# Patient Record
Sex: Female | Born: 1955 | Race: White | Hispanic: No | Smoking: Current every day smoker
Health system: Southern US, Community
[De-identification: ages and names within clinical notes are randomized; demographics above are authoritative.]

## PROBLEM LIST (undated history)

## (undated) DIAGNOSIS — F419 Anxiety disorder, unspecified: Secondary | ICD-10-CM

## (undated) DIAGNOSIS — G43909 Migraine, unspecified, not intractable, without status migrainosus: Secondary | ICD-10-CM

## (undated) DIAGNOSIS — R42 Dizziness and giddiness: Secondary | ICD-10-CM

## (undated) DIAGNOSIS — J449 Chronic obstructive pulmonary disease, unspecified: Secondary | ICD-10-CM

## (undated) HISTORY — PX: APPENDECTOMY: SHX54

## (undated) HISTORY — PX: TUBAL LIGATION: SHX77

---

## 2011-10-04 ENCOUNTER — Emergency Department (HOSPITAL_COMMUNITY)
Admission: EM | Admit: 2011-10-04 | Discharge: 2011-10-04 | Disposition: A | Discharge status: Home / Self Care | Payer: Self-pay | Attending: Emergency Medicine | Admitting: Emergency Medicine

## 2011-10-04 ENCOUNTER — Encounter (HOSPITAL_COMMUNITY): Payer: Self-pay

## 2011-10-04 ENCOUNTER — Emergency Department (HOSPITAL_COMMUNITY): Payer: Self-pay

## 2011-10-04 ENCOUNTER — Other Ambulatory Visit: Payer: Self-pay

## 2011-10-04 DIAGNOSIS — G43909 Migraine, unspecified, not intractable, without status migrainosus: Secondary | ICD-10-CM | POA: Insufficient documentation

## 2011-10-04 DIAGNOSIS — N390 Urinary tract infection, site not specified: Secondary | ICD-10-CM | POA: Insufficient documentation

## 2011-10-04 DIAGNOSIS — I509 Heart failure, unspecified: Secondary | ICD-10-CM | POA: Insufficient documentation

## 2011-10-04 DIAGNOSIS — H81399 Other peripheral vertigo, unspecified ear: Secondary | ICD-10-CM | POA: Insufficient documentation

## 2011-10-04 HISTORY — DX: Migraine, unspecified, not intractable, without status migrainosus: G43.909

## 2011-10-04 HISTORY — DX: Dizziness and giddiness: R42

## 2011-10-04 LAB — BASIC METABOLIC PANEL
CO2: 27 mEq/L (ref 19–32)
Calcium: 9.6 mg/dL (ref 8.4–10.5)
Creatinine, Ser: 0.61 mg/dL (ref 0.50–1.10)
GFR calc non Af Amer: >90 mL/min (ref >90)

## 2011-10-04 LAB — CBC
MCH: 33.5 pg (ref 26.0–34.0)
MCV: 94.7 fL (ref 78.0–100.0)
Platelets: 271 10*3/uL (ref 150–400)
RDW: 12.5 % (ref 11.5–15.5)
WBC: 10.2 10*3/uL (ref 4.0–10.5)

## 2011-10-04 LAB — POCT I-STAT TROPONIN I: Troponin i, poc: 0 ng/mL (ref 0.00–0.08)

## 2011-10-04 LAB — URINALYSIS, ROUTINE W REFLEX MICROSCOPIC
Ketones, ur: NEGATIVE mg/dL
Nitrite: POSITIVE — AB
Protein, ur: NEGATIVE mg/dL

## 2011-10-04 LAB — DIFFERENTIAL
Basophils Absolute: 0 10*3/uL (ref 0.0–0.1)
Eosinophils Absolute: 0.1 10*3/uL (ref 0.0–0.7)
Eosinophils Relative: 1 % (ref 0–5)
Lymphocytes Relative: 29 % (ref 12–46)
Neutrophils Relative %: 64 % (ref 43–77)

## 2011-10-04 LAB — URINE MICROSCOPIC-ADD ON

## 2011-10-04 MED ORDER — ONDANSETRON HCL 4 MG PO TABS: 4.0000 mg | ORAL_TABLET | Freq: Four times a day (QID) | ORAL | Status: AC | PRN

## 2011-10-04 MED ORDER — MECLIZINE HCL 12.5 MG PO TABS
50.0000 mg | ORAL_TABLET | Freq: Once | ORAL | Status: DC
  Filled 2011-10-04: qty 4

## 2011-10-04 MED ORDER — DEXTROSE 5 % IV SOLN: 1.0000 g | Freq: Once | INTRAVENOUS | Status: DC

## 2011-10-04 MED ORDER — DIPHENHYDRAMINE HCL 50 MG/ML IJ SOLN
25.0000 mg | Freq: Once | INTRAMUSCULAR | Status: AC
  Administered 2011-10-04: 25 mg via INTRAVENOUS
  Filled 2011-10-04: qty 1

## 2011-10-04 MED ORDER — CEFTRIAXONE SODIUM 1 G IJ SOLR
1.0000 g | Freq: Once | INTRAMUSCULAR | Status: AC
  Administered 2011-10-04: 1 g via INTRAMUSCULAR
  Filled 2011-10-04: qty 10

## 2011-10-04 MED ORDER — SODIUM CHLORIDE 0.9 % IV BOLUS (SEPSIS)
1000.0000 mL | Freq: Once | INTRAVENOUS | Status: AC
  Administered 2011-10-04: 1000 mL via INTRAVENOUS

## 2011-10-04 MED ORDER — CEPHALEXIN 500 MG PO CAPS: 500.0000 mg | ORAL_CAPSULE | Freq: Four times a day (QID) | ORAL | Status: AC

## 2011-10-04 MED ORDER — MECLIZINE HCL 25 MG PO TABS: 25.0000 mg | ORAL_TABLET | Freq: Three times a day (TID) | ORAL | Status: AC | PRN

## 2011-10-04 MED ORDER — ONDANSETRON HCL 4 MG/2ML IJ SOLN
4.0000 mg | INTRAMUSCULAR | Status: DC | PRN
  Administered 2011-10-04: 4 mg via INTRAVENOUS
  Filled 2011-10-04: qty 2

## 2011-10-04 MED ORDER — PROMETHAZINE HCL 25 MG/ML IJ SOLN
12.5000 mg | Freq: Once | INTRAMUSCULAR | Status: AC
  Administered 2011-10-04: 12.5 mg via INTRAVENOUS
  Filled 2011-10-04: qty 1

## 2011-10-04 NOTE — ED Provider Notes (Signed)
History     CSN: [id number]  Arrival date & time 10/04/11  1210   First MD Initiated Contact with Patient 10/04/11 1357      Chief Complaint  Patient presents with  . Dizziness    HPI Pt was seen at 1400.  Per pt, c/o gradual onset and persistence of multiple intermittent episodes of "dizziness" x3 years.  States she saw a MD 3 years ago in [place] for same but cannot recall what her dx was or what the name of the rx med she was given for her symptoms.  States she just remembers "they told me I was ok."  Describes her dizziness for the past 1 week as intermittent spinning, associated with nausea and feeling "off balance" while walking.  Symptoms worsen with head turning side-to-side and position changes.  Pt also c/o gradual onset and persistence of constant acute flair of her chronic migraine headache for the past week. Describes the headache as per her usual chronic migraine headache pain pattern for many years.  Denies headache was sudden or maximal in onset or at any time.  Denies visual changes, no focal motor weakness, no tingling/numbness in extremities, no fevers, no neck pain, no rash, no vomiting/diarrhea, no neck pain, no CP/palpitations, no SOB/cough, no abd pain, no injury.     Past Medical History  Diagnosis Date  . Dizziness   . Migraine headache     History reviewed. No pertinent past surgical history.    History  Substance Use Topics  . Smoking status: Not on file  . Smokeless tobacco: Not on file  . Alcohol Use: No    Review of Systems ROS: Statement: All systems negative except as marked or noted in the HPI; Constitutional: Negative for fever and chills. ; ; Eyes: Negative for eye pain, redness and discharge. ; ; ENMT: Negative for ear pain, hoarseness, nasal congestion, sinus pressure and sore throat. ; ; Cardiovascular: Negative for chest pain, palpitations, diaphoresis, dyspnea and peripheral edema. ; ; Respiratory: Negative for cough, wheezing and stridor. ;  ; Gastrointestinal: +nausea. Negative for vomiting, diarrhea, abdominal pain, blood in stool, hematemesis, jaundice and rectal bleeding.; ; Genitourinary: Negative for dysuria, flank pain and hematuria. ; ; Musculoskeletal: Negative for back pain and neck pain. Negative for swelling and trauma.; ; Skin: Negative for pruritus, rash, abrasions, blisters, bruising and skin lesion.; ; Neuro: +headache, dizziness.  Negative for lightheadedness and neck stiffness. Negative for weakness, altered level of consciousness , altered mental status, extremity weakness, paresthesias, involuntary movement, seizure and syncope.     Allergies  Review of patient's allergies indicates no known allergies.  Home Medications   Current Outpatient Rx  Name Route Sig Dispense Refill  . CALCIUM CARBONATE 600 MG PO TABS Oral Take 600 mg by mouth daily after breakfast.    . DIMENHYDRINATE 50 MG PO TABS Oral Take 50 mg by mouth every 8 (eight) hours as needed. Dizziness    . TETRAHYDROZOLINE-ZN SULFATE 0.05-0.25 % OP SOLN Both Eyes Place 2 drops into both eyes 3 (three) times daily as needed. Dry Eyes      BP 109/65  Pulse 63  Temp(Src) 98 F (36.7 C) (Oral)  Resp 16  Ht 5\' 5"  (1.651 m)  Wt 100 lb (45.36 kg)  BMI 16.64 kg/m2  SpO2 98%  Physical Exam 1405: Physical examination:  Nursing notes reviewed; Vital signs and O2 SAT reviewed;  Constitutional: Well developed, Well nourished, Uncomfortable appearing; Head:  Normocephalic, atraumatic; Eyes: EOMI, PERRL, No scleral  icterus; ENMT: Mouth and pharynx normal, Mucous membranes dry; Neck: Supple, Full range of motion, No lymphadenopathy; Cardiovascular: Regular rate and rhythm, No murmur, rub, or gallop; Respiratory: Breath sounds clear & equal bilaterally, No rales, rhonchi, wheezes, or rub, Normal respiratory effort/excursion; Chest: Nontender, Movement normal; Abdomen: Soft, Nontender, Nondistended, Normal bowel sounds; Genitourinary: No CVA tenderness; Extremities:  Pulses normal, No tenderness, No edema, No calf edema or asymmetry.; Neuro: AA&Ox3, Major CN grossly intact.  Strength 5/5 equal bilat UE's and LE's.  DTR 2/4 equal bilat UE's and LE's.  No gross sensory deficits.  Normal cerebellar testing after past pointing LLE and RUE, but pt corrects herself and performs finger-nose/heel-shin without past pointing when asked to perform the movements precisely, subsequent testing with bilat UE's and LE's cerebellar testing being normal.  No pronator drift.  Speech clear.  No facial droop.  +right horizontal gaze fatigable nystagmus which reproduces pt's symptoms, +left horizontal end-gaze fatigable nystagmus.; Skin: Color normal, Warm, Dry, no rash.    ED Course  Procedures   MDM  MDM Reviewed: vitals and nursing note Interpretation: ECG, x-ray, labs and CT scan    Date: 10/04/2011  Rate: 66  Rhythm: normal sinus rhythm  QRS Axis: normal  Intervals: normal  ST/T Wave abnormalities: normal  Conduction Disutrbances:none  Narrative Interpretation:   Old EKG Reviewed: none available.   Results for orders placed during the hospital encounter of 10/04/11  BASIC METABOLIC PANEL      Component Value Range   Sodium 139  135 - 145 (mEq/L)   Potassium 3.9  3.5 - 5.1 (mEq/L)   Chloride 105  96 - 112 (mEq/L)   CO2 27  19 - 32 (mEq/L)   Glucose, Bld 100 (*) 70 - 99 (mg/dL)   BUN 12  6 - 23 (mg/dL)   Creatinine, Ser 1.61  0.50 - 1.10 (mg/dL)   Calcium 9.6  8.4 - 09.6 (mg/dL)   GFR calc non Af Amer >90  >90 (mL/min)   GFR calc Af Amer >90  >90 (mL/min)  CBC      Component Value Range   WBC 10.2  4.0 - 10.5 (K/uL)   RBC 4.54  3.87 - 5.11 (MIL/uL)   Hemoglobin 15.2 (*) 12.0 - 15.0 (g/dL)   HCT 04.5  40.9 - 81.1 (%)   MCV 94.7  78.0 - 100.0 (fL)   MCH 33.5  26.0 - 34.0 (pg)   MCHC 35.3  30.0 - 36.0 (g/dL)   RDW 91.4  78.2 - 95.6 (%)   Platelets 271  150 - 400 (K/uL)  DIFFERENTIAL      Component Value Range   Neutrophils Relative 64  43 - 77 (%)    Neutro Abs 6.5  1.7 - 7.7 (K/uL)   Lymphocytes Relative 29  12 - 46 (%)   Lymphs Abs 3.0  0.7 - 4.0 (K/uL)   Monocytes Relative 7  3 - 12 (%)   Monocytes Absolute 0.7  0.1 - 1.0 (K/uL)   Eosinophils Relative 1  0 - 5 (%)   Eosinophils Absolute 0.1  0.0 - 0.7 (K/uL)   Basophils Relative 0  0 - 1 (%)   Basophils Absolute 0.0  0.0 - 0.1 (K/uL)  URINALYSIS, ROUTINE W REFLEX MICROSCOPIC      Component Value Range   Color, Urine YELLOW  YELLOW    APPearance CLEAR  CLEAR    Specific Gravity, Urine 1.015  1.005 - 1.030    pH 6.0  5.0 - 8.0  Glucose, UA NEGATIVE  NEGATIVE (mg/dL)   Hgb urine dipstick NEGATIVE  NEGATIVE    Bilirubin Urine NEGATIVE  NEGATIVE    Ketones, ur NEGATIVE  NEGATIVE (mg/dL)   Protein, ur NEGATIVE  NEGATIVE (mg/dL)   Urobilinogen, UA 0.2  0.0 - 1.0 (mg/dL)   Nitrite POSITIVE (*) NEGATIVE    Leukocytes, UA TRACE (*) NEGATIVE   POCT I-STAT TROPONIN I      Component Value Range   Troponin i, poc 0.00  0.00 - 0.08 (ng/mL)   Comment 3           URINE MICROSCOPIC-ADD ON      Component Value Range   Squamous Epithelial / LPF FEW (*) RARE    WBC, UA 3-6  <3 (WBC/hpf)   Bacteria, UA MANY (*) RARE     Dg Chest 2 View 10/04/2011  *RADIOLOGY REPORT*  Clinical Data: 56 year old female with weakness, dizziness, CHF.  CHEST - 2 VIEW  Comparison: [facility] 06/16/2011.  Findings: Stable large lung volumes.  Cardiac size and mediastinal contours are within normal limits.  Visualized tracheal air column is within normal limits.  No pneumothorax, pulmonary edema, pleural effusion or confluent pulmonary opacity. No acute osseous abnormality identified.  No pneumoperitoneum.  Negative visualized bowel gas pattern.  IMPRESSION: No acute cardiopulmonary abnormality.  Original Report Authenticated By: Harley Hallmark, M.D.   Ct Head Wo Contrast 10/04/2011  *RADIOLOGY REPORT*  Clinical Data: 56 year old female with headache, nausea, dizziness.  CT HEAD WITHOUT CONTRAST   Technique:  Contiguous axial images were obtained from the base of the skull through the vertex without contrast.  Comparison: None.  Findings: Visualized paranasal sinuses and mastoids are clear.  No acute osseous abnormality identified.  Visualized orbit soft tissues are within normal limits.  Visualized scalp soft tissues are within normal limits.  Cerebral volume is within normal limits for age.  No midline shift, ventriculomegaly, mass effect, evidence of mass lesion, intracranial hemorrhage or evidence of cortically based acute infarction.  Gray-white matter differentiation is within normal limits throughout the brain.  No suspicious intracranial vascular hyperdensity.  IMPRESSION: Normal noncontrast CT appearance of the brain.  Original Report Authenticated By: Harley Hallmark, M.D.   Mr Brain Wo Contrast 10/04/2011  *RADIOLOGY REPORT*  Clinical Data: Dizziness and nausea  MRI HEAD WITHOUT CONTRAST  Technique:  Multiplanar, multiecho pulse sequences of the brain and surrounding structures were obtained according to standard protocol without intravenous contrast.  Comparison: CT head 10/04/2011  Findings: Image quality degraded by moderate patient motion.  This level of motion could obscure small lesions.  Ventricle size is normal.  Pituitary is normal in size.  Corpus callosum is well formed.  Negative for acute infarct.  Small hyperintensities in the frontal white matter bilaterally, most likely related to chronic microvascular ischemia.  Brainstem and cerebellum are normal.  Negative for hemorrhage or mass lesion.  Paranasal sinuses are clear.  Mastoid sinus is clear.  IMPRESSION: Small chronic lesions in the frontal white matter bilaterally, likely due to chronic ischemia.  No acute abnormality.  Original Report Authenticated By: Camelia Phenes, M.D.    6:41 PM:  T/C to Neuro Dr. Roseanne Reno, case discussed, including:  HPI, pertinent PM/SHx, VS/PE, dx testing, ED course and treatment:  Agreeable with ED  treatment, as an aside: cannot have neuro lesion to effect LUE and RLE and this testing is normal when pt is instructed to perform the movements precisely, tx symptomatically.  Pt remains with neuro re-exams  intact for myself and ED RN when instructed to perform finger-nose/heel-shin precisely.  Pt ambulated with steady gait.  Feels better and wants to go home now.  Will give 1st dose abx for UTI, UC pending.  Dx testing d/w pt and family.  Questions answered.  Verb understanding, agreeable to d/c home with outpt f/u.       Laray Anger, DO 10/06/11 [phone]

## 2011-10-04 NOTE — ED Notes (Signed)
Complain of nausea, headache and dizziness that started four days ago

## 2011-10-04 NOTE — Discharge Instructions (Signed)
RESOURCE GUIDE  Dental Problems  Patients with Medicaid: [facility]                     [facility] [address]                                           [address] Phone:  [phone]                                                  Phone:  [phone]  If unable to pay or uninsured, contact:  [facility] or [facility] to become qualified for the adult dental clinic.  Chronic Pain Problems Contact [facility]  [phone] Patients need to be referred by their primary care doctor.  Insufficient Money for Medicine Contact [employer]:  call "211" or [facility] [phone].  No Primary Care Doctor Call [facility]  [phone] Other agencies that provide inexpensive medical care    [facility]  [phone]    [facility]  [phone]    [facility]  [phone]    Women's Clinic  [phone]    [facility]  [phone]    [facility]  [phone]  Psychological Services [facility]  [phone] [facility]  [phone] [facility]   [phone] (emergency services [phone])  Substance Abuse Resources [facility]  336-882-2125 [facility] 336-784-9470 [facility] 336-285-9073 [facility] 336-845-3988 Residential & Outpatient Substance Abuse Program  800-659-3381  Abuse/Neglect [place] Child Abuse Hotline (336) 641-3795 [place] Child Abuse Hotline 800-378-5315 (After Hours)  Emergency Shelter [facility] (336) 271-5985  Maternity Homes [facility] (336) 275-9566 [facility] (704) 372-4663  MRSA Hotline #:   832-7006    [place] [facility]  [facility]     [employer]                          [facility] [address] [place]                       [address]      [address]  [place]                                                [place]                            [place] Phone:  [phone]                                   Phone:  [phone]                 Phone:  [phone]  [facility] Phone:  [phone]    [facility] Child Abuse Hotline (463)308-0101 (331)287-4543 (After Hours)    Take the prescriptions as directed.  Call your regular medical doctor and the ENT doctor tomorrow morning to schedule a follow up appointment within the next week.  Return to the Emergency Department immediately sooner if worsening.

## 2011-10-04 NOTE — ED Notes (Signed)
Pt ambulated to the bathroom twice. States the first time she felt a little dizzy but the second time she did not

## 2011-10-06 LAB — URINE CULTURE

## 2011-10-07 NOTE — ED Notes (Signed)
Treated per protocol MD; Sensitive to same 

## 2013-05-16 ENCOUNTER — Ambulatory Visit: Payer: Self-pay | Admitting: Endocrinology

## 2013-05-30 ENCOUNTER — Ambulatory Visit: Payer: Self-pay | Admitting: Endocrinology

## 2018-04-09 ENCOUNTER — Encounter: Payer: Self-pay | Admitting: Nutrition

## 2019-09-06 ENCOUNTER — Ambulatory Visit
Admission: EM | Admit: 2019-09-06 | Discharge: 2019-09-06 | Disposition: A | Discharge status: Home / Self Care | Payer: Self-pay

## 2019-09-06 ENCOUNTER — Other Ambulatory Visit: Payer: Self-pay

## 2019-09-06 ENCOUNTER — Encounter (HOSPITAL_COMMUNITY): Payer: Self-pay | Admitting: Emergency Medicine

## 2019-09-06 ENCOUNTER — Emergency Department (HOSPITAL_COMMUNITY)
Admission: EM | Admit: 2019-09-06 | Discharge: 2019-09-06 | Disposition: A | Discharge status: Home / Self Care | Payer: Self-pay | Attending: Emergency Medicine | Admitting: Emergency Medicine

## 2019-09-06 DIAGNOSIS — Z5321 Procedure and treatment not carried out due to patient leaving prior to being seen by health care provider: Secondary | ICD-10-CM | POA: Insufficient documentation

## 2019-09-06 LAB — CBG MONITORING, ED: Glucose-Capillary: 122 mg/dL — ABNORMAL HIGH (ref 70–99)

## 2019-09-06 NOTE — ED Triage Notes (Signed)
Pt reports had syncopal episode this am. Pt reports was taken to [facility] and reports "couldn't find anything wrong with me". Pt reprots was discharged from there and went home. Pt spouse called EMS and brought pt here for further evaluation. pt reports has been treated for vertigo for the last several years.   Pt denies neuro symptoms or taking blood thinners at this time. Pt reports chronic back pain.   Poor concentration noted in triage.

## 2020-11-10 ENCOUNTER — Emergency Department (HOSPITAL_COMMUNITY): Payer: 59

## 2020-11-10 ENCOUNTER — Encounter (HOSPITAL_COMMUNITY): Payer: Self-pay | Admitting: Emergency Medicine

## 2020-11-10 ENCOUNTER — Other Ambulatory Visit: Payer: Self-pay

## 2020-11-10 ENCOUNTER — Inpatient Hospital Stay (HOSPITAL_COMMUNITY)
Admission: EM | Admit: 2020-11-10 | Discharge: 2020-11-13 | DRG: 917 | Disposition: A | Discharge status: Home / Self Care | Payer: 59 | Attending: Internal Medicine | Admitting: Internal Medicine

## 2020-11-10 DIAGNOSIS — I493 Ventricular premature depolarization: Secondary | ICD-10-CM | POA: Diagnosis present

## 2020-11-10 DIAGNOSIS — F1310 Sedative, hypnotic or anxiolytic abuse, uncomplicated: Secondary | ICD-10-CM | POA: Diagnosis present

## 2020-11-10 DIAGNOSIS — Z20822 Contact with and (suspected) exposure to covid-19: Secondary | ICD-10-CM | POA: Diagnosis present

## 2020-11-10 DIAGNOSIS — F1210 Cannabis abuse, uncomplicated: Secondary | ICD-10-CM | POA: Diagnosis present

## 2020-11-10 DIAGNOSIS — E876 Hypokalemia: Secondary | ICD-10-CM | POA: Diagnosis present

## 2020-11-10 DIAGNOSIS — F1910 Other psychoactive substance abuse, uncomplicated: Secondary | ICD-10-CM

## 2020-11-10 DIAGNOSIS — R1110 Vomiting, unspecified: Secondary | ICD-10-CM

## 2020-11-10 DIAGNOSIS — J9601 Acute respiratory failure with hypoxia: Secondary | ICD-10-CM | POA: Diagnosis not present

## 2020-11-10 DIAGNOSIS — G928 Other toxic encephalopathy: Secondary | ICD-10-CM | POA: Diagnosis present

## 2020-11-10 DIAGNOSIS — T405X1A Poisoning by cocaine, accidental (unintentional), initial encounter: Principal | ICD-10-CM | POA: Diagnosis present

## 2020-11-10 DIAGNOSIS — G9341 Metabolic encephalopathy: Secondary | ICD-10-CM

## 2020-11-10 DIAGNOSIS — T40711A Poisoning by cannabis, accidental (unintentional), initial encounter: Secondary | ICD-10-CM | POA: Diagnosis present

## 2020-11-10 DIAGNOSIS — R197 Diarrhea, unspecified: Secondary | ICD-10-CM | POA: Diagnosis present

## 2020-11-10 DIAGNOSIS — F1410 Cocaine abuse, uncomplicated: Secondary | ICD-10-CM | POA: Diagnosis present

## 2020-11-10 DIAGNOSIS — T424X1A Poisoning by benzodiazepines, accidental (unintentional), initial encounter: Secondary | ICD-10-CM | POA: Diagnosis present

## 2020-11-10 DIAGNOSIS — G9340 Encephalopathy, unspecified: Secondary | ICD-10-CM | POA: Diagnosis present

## 2020-11-10 DIAGNOSIS — R4182 Altered mental status, unspecified: Secondary | ICD-10-CM | POA: Diagnosis present

## 2020-11-10 DIAGNOSIS — R112 Nausea with vomiting, unspecified: Secondary | ICD-10-CM | POA: Diagnosis present

## 2020-11-10 DIAGNOSIS — I471 Supraventricular tachycardia: Secondary | ICD-10-CM | POA: Diagnosis present

## 2020-11-10 DIAGNOSIS — E872 Acidosis: Secondary | ICD-10-CM | POA: Diagnosis present

## 2020-11-10 LAB — CBC WITH DIFFERENTIAL/PLATELET
Abs Immature Granulocytes: 0.04 10*3/uL (ref 0.00–0.07)
Basophils Absolute: 0 10*3/uL (ref 0.0–0.1)
Basophils Relative: 0 %
Eosinophils Absolute: 0 10*3/uL (ref 0.0–0.5)
Eosinophils Relative: 0 %
HCT: 46 % (ref 36.0–46.0)
Hemoglobin: 14.8 g/dL (ref 12.0–15.0)
Immature Granulocytes: 0 %
Lymphocytes Relative: 8 %
Lymphs Abs: 1 10*3/uL (ref 0.7–4.0)
MCH: 31.2 pg (ref 26.0–34.0)
MCHC: 32.2 g/dL (ref 30.0–36.0)
MCV: 97 fL (ref 80.0–100.0)
Monocytes Absolute: 0.4 10*3/uL (ref 0.1–1.0)
Monocytes Relative: 4 %
Neutro Abs: 10.7 10*3/uL — ABNORMAL HIGH (ref 1.7–7.7)
Neutrophils Relative %: 88 %
Platelets: 164 10*3/uL (ref 150–400)
RBC: 4.74 MIL/uL (ref 3.87–5.11)
RDW: 14.7 % (ref 11.5–15.5)
WBC: 12.2 10*3/uL — ABNORMAL HIGH (ref 4.0–10.5)
nRBC: 0 % (ref 0.0–0.2)

## 2020-11-10 LAB — BLOOD GAS, ARTERIAL
Acid-base deficit: 2.6 mmol/L — ABNORMAL HIGH (ref 0.0–2.0)
Bicarbonate: 22.9 mmol/L (ref 20.0–28.0)
FIO2: 36
O2 Saturation: 96.5 %
Patient temperature: 37.4
pCO2 arterial: 30.4 mmHg — ABNORMAL LOW (ref 32.0–48.0)
pH, Arterial: 7.45 (ref 7.350–7.450)
pO2, Arterial: 92 mmHg (ref 83.0–108.0)

## 2020-11-10 LAB — COMPREHENSIVE METABOLIC PANEL
ALT: 29 U/L (ref 0–44)
AST: 30 U/L (ref 15–41)
Albumin: 4.2 g/dL (ref 3.5–5.0)
Alkaline Phosphatase: 78 U/L (ref 38–126)
Anion gap: 17 — ABNORMAL HIGH (ref 5–15)
BUN: 15 mg/dL (ref 8–23)
CO2: 18 mmol/L — ABNORMAL LOW (ref 22–32)
Calcium: 9.2 mg/dL (ref 8.9–10.3)
Chloride: 107 mmol/L (ref 98–111)
Creatinine, Ser: 0.61 mg/dL (ref 0.44–1.00)
GFR, Estimated: >60 mL/min (ref >60)
Glucose, Bld: 183 mg/dL — ABNORMAL HIGH (ref 70–99)
Potassium: 3.3 mmol/L — ABNORMAL LOW (ref 3.5–5.1)
Sodium: 142 mmol/L (ref 135–145)
Total Bilirubin: 0.7 mg/dL (ref 0.3–1.2)
Total Protein: 7.9 g/dL (ref 6.5–8.1)

## 2020-11-10 LAB — RESP PANEL BY RT-PCR (FLU A&B, COVID) ARPGX2
Influenza A by PCR: NEGATIVE
Influenza B by PCR: NEGATIVE
SARS Coronavirus 2 by RT PCR: NEGATIVE

## 2020-11-10 LAB — BASIC METABOLIC PANEL
Anion gap: 12 (ref 5–15)
BUN: 14 mg/dL (ref 8–23)
CO2: 21 mmol/L — ABNORMAL LOW (ref 22–32)
Calcium: 8.8 mg/dL — ABNORMAL LOW (ref 8.9–10.3)
Chloride: 108 mmol/L (ref 98–111)
Creatinine, Ser: 0.51 mg/dL (ref 0.44–1.00)
GFR, Estimated: >60 mL/min (ref >60)
Glucose, Bld: 117 mg/dL — ABNORMAL HIGH (ref 70–99)
Potassium: 3.2 mmol/L — ABNORMAL LOW (ref 3.5–5.1)
Sodium: 141 mmol/L (ref 135–145)

## 2020-11-10 LAB — RAPID URINE DRUG SCREEN, HOSP PERFORMED
Amphetamines: NOT DETECTED
Barbiturates: NOT DETECTED
Benzodiazepines: POSITIVE — AB
Cocaine: POSITIVE — AB
Opiates: NOT DETECTED
Tetrahydrocannabinol: POSITIVE — AB

## 2020-11-10 LAB — SALICYLATE LEVEL: Salicylate Lvl: <7 mg/dL — ABNORMAL LOW (ref 7.0–30.0)

## 2020-11-10 LAB — ETHANOL: Alcohol, Ethyl (B): <10 mg/dL (ref <10)

## 2020-11-10 LAB — URINALYSIS, ROUTINE W REFLEX MICROSCOPIC
Bacteria, UA: NONE SEEN
Bilirubin Urine: NEGATIVE
Glucose, UA: NEGATIVE mg/dL
Hgb urine dipstick: NEGATIVE
Ketones, ur: 80 mg/dL — AB
Leukocytes,Ua: NEGATIVE
Nitrite: NEGATIVE
Protein, ur: 100 mg/dL — AB
Specific Gravity, Urine: 1.023 (ref 1.005–1.030)
pH: 6 (ref 5.0–8.0)

## 2020-11-10 LAB — LACTIC ACID, PLASMA
Lactic Acid, Venous: 2.8 mmol/L (ref 0.5–1.9)
Lactic Acid, Venous: 1.8 mmol/L (ref 0.5–1.9)

## 2020-11-10 LAB — MAGNESIUM: Magnesium: 1.6 mg/dL — ABNORMAL LOW (ref 1.7–2.4)

## 2020-11-10 LAB — ACETAMINOPHEN LEVEL: Acetaminophen (Tylenol), Serum: <10 ug/mL — ABNORMAL LOW (ref 10–30)

## 2020-11-10 LAB — TROPONIN I (HIGH SENSITIVITY)
Troponin I (High Sensitivity): 10 ng/L (ref <18)
Troponin I (High Sensitivity): 11 ng/L (ref <18)

## 2020-11-10 LAB — AMMONIA: Ammonia: 19 umol/L (ref 9–35)

## 2020-11-10 LAB — LIPASE, BLOOD: Lipase: 19 U/L (ref 11–51)

## 2020-11-10 MED ORDER — ENOXAPARIN SODIUM 40 MG/0.4ML ~~LOC~~ SOLN
40.0000 mg | SUBCUTANEOUS | Status: DC
  Administered 2020-11-10 – 2020-11-12 (×3): 40 mg via SUBCUTANEOUS
  Filled 2020-11-10 (×3): qty 0.4

## 2020-11-10 MED ORDER — POLYETHYLENE GLYCOL 3350 17 G PO PACK: 17.0000 g | PACK | Freq: Every day | ORAL | Status: DC | PRN

## 2020-11-10 MED ORDER — ONDANSETRON HCL 4 MG PO TABS: 4.0000 mg | ORAL_TABLET | Freq: Four times a day (QID) | ORAL | Status: DC | PRN

## 2020-11-10 MED ORDER — VANCOMYCIN HCL 1250 MG/250ML IV SOLN
1250.0000 mg | Freq: Once | INTRAVENOUS | Status: AC
  Administered 2020-11-10: 1250 mg via INTRAVENOUS
  Filled 2020-11-10: qty 250

## 2020-11-10 MED ORDER — SODIUM CHLORIDE 0.9 % IV BOLUS: 1000.0000 mL | Freq: Once | INTRAVENOUS | Status: DC

## 2020-11-10 MED ORDER — LACTATED RINGERS IV SOLN
INTRAVENOUS | Status: DC
Start: 2020-11-10 — End: 2020-11-10

## 2020-11-10 MED ORDER — LACTATED RINGERS IV BOLUS (SEPSIS)
1000.0000 mL | Freq: Once | INTRAVENOUS | Status: AC
  Administered 2020-11-10: 1000 mL via INTRAVENOUS

## 2020-11-10 MED ORDER — ACETAMINOPHEN 325 MG PO TABS: 650.0000 mg | ORAL_TABLET | Freq: Four times a day (QID) | ORAL | Status: DC | PRN

## 2020-11-10 MED ORDER — ACETAMINOPHEN 650 MG RE SUPP: 650.0000 mg | Freq: Four times a day (QID) | RECTAL | Status: DC | PRN

## 2020-11-10 MED ORDER — ONDANSETRON HCL 4 MG/2ML IJ SOLN
4.0000 mg | Freq: Four times a day (QID) | INTRAMUSCULAR | Status: DC | PRN
  Administered 2020-11-11: 4 mg via INTRAVENOUS
  Filled 2020-11-10: qty 2

## 2020-11-10 MED ORDER — POTASSIUM CHLORIDE IN NACL 40-0.9 MEQ/L-% IV SOLN: INTRAVENOUS | Status: AC

## 2020-11-10 MED ORDER — SODIUM CHLORIDE 0.9 % IV SOLN
2.0000 g | Freq: Once | INTRAVENOUS | Status: AC
  Administered 2020-11-10: 2 g via INTRAVENOUS
  Filled 2020-11-10: qty 2

## 2020-11-10 MED ORDER — VANCOMYCIN HCL IN DEXTROSE 1-5 GM/200ML-% IV SOLN
1000.0000 mg | Freq: Once | INTRAVENOUS | Status: DC
Start: 2020-11-10 — End: 2020-11-10

## 2020-11-10 MED ORDER — POTASSIUM CHLORIDE CRYS ER 20 MEQ PO TBCR
40.0000 meq | EXTENDED_RELEASE_TABLET | Freq: Once | ORAL | Status: AC
  Administered 2020-11-10: 40 meq via ORAL
  Filled 2020-11-10: qty 2

## 2020-11-10 NOTE — ED Notes (Signed)
Lab tech at bedside

## 2020-11-10 NOTE — Sepsis Progress Note (Signed)
elink monitoring code sepsis.  

## 2020-11-10 NOTE — ED Notes (Signed)
O2 turned off.

## 2020-11-10 NOTE — ED Notes (Signed)
Pt is very anxious. 

## 2020-11-10 NOTE — ED Notes (Signed)
PCXR done

## 2020-11-10 NOTE — ED Triage Notes (Signed)
Came via EMS form home.  Husband called out that pt has been vomiting since yesterday bright yellow emesis.  Pt is anxious per EMS.  C/o headache.

## 2020-11-10 NOTE — H&P (Signed)
History and Physical    [name] NWG:[id number] DOB: September 20, 1955 DOA: 11/10/2020  PCP: Kirstie Peri, MD   Patient coming from: Home  I have personally briefly reviewed patient's old medical records in [facility]  Chief Complaint: Confusion, Vomiting  HPI: Helen Tapia is a 65 y.o. female with medical history significant for dizziness, migraines. Patient presented to the ED via EMS with reports of vomiting since yesterday and confusion. History from patient is limited, she does not remember the events of yesterday or today, she is not really sure she why she was brought to the ED.  She is able to answer questions, is able to tell me her name and knows she is in the hospital.  She tells me she takes gabapentin, but denies any other illicit or otherwise.  She denies alcohol intake.  She tells me she lives with her boyfriend -she tells me his name is [place].  Since arrival in the ED, no episodes of vomiting, no loose stools, no abdominal pain.  She reports onset of low back pain today, without lower extremity weakness or numbness.  ED provider was able to contact patient's husband recommended getting a tox screen on patient's and calling patient's live-in boyfriend.  Patient's live-in boyfriend- Clyde Miller-reported patient had been vomiting yesterday, and that patient became confused.  He admits to marijuana use by patient, but denies any other drug use.  Reports occasional alcohol intake, last alcoholic beverage was daiquri 3 days ago.  ED Course: T max-99.4, heart rate ranging from 33 to 92, respiratory rate 15- 34.  Blood pressure systolic 109-149.  O2 sats  > 92% on room air.  UDS positive for cocaine, benzodiazepine and tetrahydrocannabinol.  Portable chest x-ray head CT without acute abnormality. With lactic acid of 2.8 >> 1.8, there was a concern for sepsis, so ceftriaxone and vancomycin was started, 1 L bolus Ringer's lactate given.  Review of Systems: As per HPI all other systems  reviewed and negative.  Past Medical History:  Diagnosis Date  . Dizziness   . Migraine headache     History reviewed. No pertinent surgical history.   reports that she has never smoked. She has never used smokeless tobacco. She reports that she does not drink alcohol and does not use drugs.  No Known Allergies  Unable to ascertain due to altered mental status  Prior to Admission medications   Medication Sig Start Date End Date Taking? Authorizing Provider  calcium carbonate (OS-CAL) 600 MG TABS Take 600 mg by mouth daily after breakfast.    [provider]  dimenhyDRINATE (DRAMAMINE) 50 MG tablet Take 50 mg by mouth every 8 (eight) hours as needed. Dizziness    [provider]  tetrahydrozoline-zinc (VISINE-AC) 0.05-0.25 % ophthalmic solution Place 2 drops into both eyes 3 (three) times daily as needed. Dry Eyes    [provider]    Physical Exam: Vitals:   11/10/20 1630 11/10/20 1700 11/10/20 1730 11/10/20 1800  BP: 117/71 131/64 121/70 139/68  Pulse: 61 68 (!) 35 (!) 41  Resp: (!) 22 18 17  (!) 22  Temp:      TempSrc:      SpO2: 95% 93% 93% 93%  Weight:      Height:        Constitutional: NAD, calm, comfortable with intermittent episodes of what appears to be anxiety/panic attacks which patient's reports she has a history of. Vitals:   11/10/20 1630 11/10/20 1700 11/10/20 1730 11/10/20 1800  BP: 117/71 131/64  121/70 139/68  Pulse: 61 68 (!) 35 (!) 41  Resp: (!) 22 18 17  (!) 22  Temp:      TempSrc:      SpO2: 95% 93% 93% 93%  Weight:      Height:       Eyes: PERRL, lids and conjunctivae normal ENMT: Mucous membranes are moist.  Neck: normal, supple, no masses, no thyromegaly Respiratory: Intermittent and transient tachypnea likely secondary to anxiety/panic attacks, clear to auscultation bilaterally, no wheezing, no crackles. Normal respiratory effort. No accessory muscle use.  Cardiovascular: Regular rate and rhythm, no murmurs / rubs  / gallops. No extremity edema. 2+ pedal pulses. Abdomen: no tenderness, no masses palpated. No hepatosplenomegaly. Bowel sounds positive.  Musculoskeletal: no clubbing / cyanosis. No joint deformity upper and lower extremities. Good ROM, no contractures. Normal muscle tone.  Skin: no rashes, lesions, ulcers. No induration Neurologic: No apparent cranial abnormality, moving extremities spontaneously.  Psychiatric: Alert and oriented to person and place only.   Labs on Admission: I have personally reviewed following labs and imaging studies  CBC: Recent Labs  Lab 11/10/20 1023  WBC 12.2*  NEUTROABS 10.7*  HGB 14.8  HCT 46.0  MCV 97.0  PLT 164   Basic Metabolic Panel: Recent Labs  Lab 11/10/20 1023 11/10/20 1652  NA 142 141  K 3.3* 3.2*  CL 107 108  CO2 18* 21*  GLUCOSE 183* 117*  BUN 15 14  CREATININE 0.61 0.51  CALCIUM 9.2 8.8*   Liver Function Tests: Recent Labs  Lab 11/10/20 1023  AST 30  ALT 29  ALKPHOS 78  BILITOT 0.7  PROT 7.9  ALBUMIN 4.2   Recent Labs  Lab 11/10/20 1023  LIPASE 19   Recent Labs  Lab 11/10/20 1026  AMMONIA 19   Urine analysis:    Component Value Date/Time   COLORURINE YELLOW 11/10/2020 0958   APPEARANCEUR CLEAR 11/10/2020 0958   LABSPEC 1.023 11/10/2020 0958   PHURINE 6.0 11/10/2020 0958   GLUCOSEU NEGATIVE 11/10/2020 0958   HGBUR NEGATIVE 11/10/2020 0958   BILIRUBINUR NEGATIVE 11/10/2020 0958   KETONESUR 80 (A) 11/10/2020 0958   PROTEINUR 100 (A) 11/10/2020 0958   UROBILINOGEN 0.2 10/04/2011 1502   NITRITE NEGATIVE 11/10/2020 0958   LEUKOCYTESUR NEGATIVE 11/10/2020 0958    Radiological Exams on Admission: CT Head Wo Contrast  Result Date: 11/10/2020 CLINICAL DATA:  Vomiting and altered mental status EXAM: CT HEAD WITHOUT CONTRAST TECHNIQUE: Contiguous axial images were obtained from the base of the skull through the vertex without intravenous contrast. COMPARISON:  08/14/2020 FINDINGS: Brain: No evidence of acute  infarction, hemorrhage, hydrocephalus, extra-axial collection or mass lesion/mass effect. Vascular: No hyperdense vessel or unexpected calcification. Skull: Normal. Negative for fracture or focal lesion. Sinuses/Orbits: No acute finding. Other: None. IMPRESSION: No acute intracranial abnormality noted. Electronically Signed   By: 08/16/2020 M.D.   On: 11/10/2020 14:05   DG Chest Port 1 View  Result Date: 11/10/2020 CLINICAL DATA:  Altered level of consciousness, weakness, chest pain, abdominal pain, nausea, vomiting EXAM: PORTABLE CHEST 1 VIEW COMPARISON:  Portable exam 1008 hours compared to 09/28/2020 FINDINGS: Rotated to the LEFT. Upper normal heart size. Mediastinal contours and pulmonary vascularity normal. Bronchitic changes with minimal scarring at lingula. No pulmonary infiltrate, pleural effusion or pneumothorax. Bones demineralized. IMPRESSION: Bronchitic changes without infiltrate. Electronically Signed   By: 11/26/2020 M.D.   On: 11/10/2020 10:57    EKG: Independently reviewed.  Sinus rhythm, rate 82, QTc 412.  Ventricular  bigeminy.  Last EKG 2013, apart from ventricular contractions no other changes.  Assessment/Plan Active Problems:   AMS (altered mental status)   Metabolic/toxic encephalopathy-UDS positive for cocaine, benzos and cannabinoids.  Head CT without acute abnormality.  Temp 99.4.  WBC 12.2.  Lactic acid 2.8 > 1.8- Likely 2/2 dehydration- vomiting. - Blood alcohol, salicylate, tylenol and ammonia all WNL.   - UA, chest x-ray without acute abnormality.  No focus of infection identified at this time. -Hold further antibiotics for now -1 L Ringer's lactate given, continue N/s + 40 KCL 100cc/hr x 20hrs -Follow-up blood cultures obtained in ED  Hypokalemia, vomiting-potassium 3.3.  Abdominal exam benign.  No further episodes of vomiting in ED. -Defer abdominal imaging for now -IV fluids - Bowel rest with clear liquid diet, advance as tolerated -Zofran as needed -  Replete Lytes - Check mag  Mild anion gap metabolic acidosis, serum bicarb 18 > 21, anion gap of 17 > 12.  Likely due to lactic acidosis.  Resolving. -Hydrate  DVT prophylaxis: Lovenox Code Status: Full code Family Communication: None at bedside Disposition Plan: ~ 1-2 days Consults called: None Admission status: Obs, tele    Onnie Boer MD [facility]  11/10/2020, 6:56 PM

## 2020-11-10 NOTE — ED Notes (Addendum)
Pt is very anxious, well healing scab noted to right knee and slight light yellow bruising noted to left knee.  Pt denies falling.

## 2020-11-10 NOTE — ED Notes (Signed)
Tolerating apple sauce and ginger-ale well.  Lab Tech at bedside.

## 2020-11-10 NOTE — ED Provider Notes (Signed)
[facility] EMERGENCY DEPARTMENT Provider Note   CSN: [id number] Arrival date & time: 11/10/20  7106     History Chief Complaint  Patient presents with  . Vomiting    Helen Tapia is a 65 y.o. female with a history of migraine headache presenting with a 1 day history of vomiting and diarrhea.  Patient also is obviously confused and very anxious with her initial presentation and offers little further information regarding her symptoms.  She did give permission to contact her husband for further information.  Call placed to [facility] who advised me that they have been separated for the past year and requested I contact her live-in boyfriend.  He did offer that he was aware she was being transported here this morning and suggested we complete a tox screen on her.  After permission given by patient, her boyfriend Theador Hawthorne was contacted who states he really does not know what is wrong with her but she has been vomiting since early yesterday morning and he noted yesterday that she was confused, did not know who he was and could not name her birthday.  When asked if she uses alcohol or other illicit drugs, he stated she does smoke marijuana, no other drugs that he is aware of.  She drinks occasionally, last had a daiquiri 3 days ago.  He offers no further medical information.  Review of chart from [facility], UDS performed there was positive for marijuana and opiates on 08/14/2020.Marland Kitchen  Level 5 caveat given patient presentation and confusion.  HPI     Past Medical History:  Diagnosis Date  . Dizziness   . Migraine headache     There are no problems to display for this patient.   History reviewed. No pertinent surgical history.   OB History   No obstetric history on file.     History reviewed. No pertinent family history.  Social History   Tobacco Use  . Smoking status: Never Smoker  . Smokeless tobacco: Never Used  Substance Use Topics  . Alcohol use: No  . Drug  use: No    Home Medications Prior to Admission medications   Medication Sig Start Date End Date Taking? Authorizing Provider  calcium carbonate (OS-CAL) 600 MG TABS Take 600 mg by mouth daily after breakfast.    [provider]  dimenhyDRINATE (DRAMAMINE) 50 MG tablet Take 50 mg by mouth every 8 (eight) hours as needed. Dizziness    [provider]  tetrahydrozoline-zinc (VISINE-AC) 0.05-0.25 % ophthalmic solution Place 2 drops into both eyes 3 (three) times daily as needed. Dry Eyes    [provider]    Allergies    Patient has no known allergies.  Review of Systems   Review of Systems  Unable to perform ROS: Mental status change  Gastrointestinal: Positive for diarrhea and vomiting.    Physical Exam Updated Vital Signs BP 131/64   Pulse 68   Temp 99.4 F (37.4 C) (Rectal)   Resp 18   Ht 5\' 4"  (1.626 m)   Wt 59 kg   SpO2 93%   BMI 22.33 kg/m   Physical Exam Vitals and nursing note reviewed.  Constitutional:      General: She is in acute distress.     Appearance: She is well-developed. She is ill-appearing.     Comments: Patient is very anxious on exam.  Tremor with complaints of feeling cold.  She is afebrile.  HENT:     Head: Normocephalic and atraumatic.  Eyes:  Conjunctiva/sclera: Conjunctivae normal.  Cardiovascular:     Rate and Rhythm: Normal rate and regular rhythm.     Heart sounds: Normal heart sounds.  Pulmonary:     Effort: Pulmonary effort is normal.     Breath sounds: Normal breath sounds. No wheezing.  Abdominal:     General: Bowel sounds are normal.     Palpations: Abdomen is soft.     Tenderness: There is generalized abdominal tenderness. There is no guarding or rebound.     Comments: Complaint of generalized abdominal pain but without guarding or rebound.  Patient has dried bilious emesis on face and pajamas.  Musculoskeletal:        General: Normal range of motion.     Cervical back: Normal range of motion.   Skin:    General: Skin is warm and dry.  Neurological:     Mental Status: She is alert. She is disoriented.  Psychiatric:        Mood and Affect: Mood is anxious.     ED Results / Procedures / Treatments   Labs (all labs ordered are listed, but only abnormal results are displayed) Labs Reviewed  COMPREHENSIVE METABOLIC PANEL - Abnormal; Notable for the following components:      Result Value   Potassium 3.3 (*)    CO2 18 (*)    Glucose, Bld 183 (*)    Anion gap 17 (*)    All other components within normal limits  CBC WITH DIFFERENTIAL/PLATELET - Abnormal; Notable for the following components:   WBC 12.2 (*)    Neutro Abs 10.7 (*)    All other components within normal limits  URINALYSIS, ROUTINE W REFLEX MICROSCOPIC - Abnormal; Notable for the following components:   Ketones, ur 80 (*)    Protein, ur 100 (*)    All other components within normal limits  LACTIC ACID, PLASMA - Abnormal; Notable for the following components:   Lactic Acid, Venous 2.8 (*)    All other components within normal limits  RAPID URINE DRUG SCREEN, HOSP PERFORMED - Abnormal; Notable for the following components:   Cocaine POSITIVE (*)    Benzodiazepines POSITIVE (*)    Tetrahydrocannabinol POSITIVE (*)    All other components within normal limits  SALICYLATE LEVEL - Abnormal; Notable for the following components:   Salicylate Lvl <7.0 (*)    All other components within normal limits  ACETAMINOPHEN LEVEL - Abnormal; Notable for the following components:   Acetaminophen (Tylenol), Serum <10 (*)    All other components within normal limits  BLOOD GAS, ARTERIAL - Abnormal; Notable for the following components:   pCO2 arterial 30.4 (*)    Acid-base deficit 2.6 (*)    All other components within normal limits  BASIC METABOLIC PANEL - Abnormal; Notable for the following components:   Potassium 3.2 (*)    CO2 21 (*)    Glucose, Bld 117 (*)    Calcium 8.8 (*)    All other components within normal  limits  CULTURE, BLOOD (ROUTINE X 2)  CULTURE, BLOOD (ROUTINE X 2)  RESP PANEL BY RT-PCR (FLU A&B, COVID) ARPGX2  LIPASE, BLOOD  ETHANOL  AMMONIA  LACTIC ACID, PLASMA  TROPONIN I (HIGH SENSITIVITY)  TROPONIN I (HIGH SENSITIVITY)    EKG EKG Interpretation  Date/Time:  Tuesday November 10 2020 09:56:30 EDT Ventricular Rate:  82 PR Interval:    QRS Duration: 61 QT Interval:  434 QTC Calculation: 412 R Axis:   -15 Text Interpretation: Sinus rhythm Ventricular  bigeminy Borderline left axis deviation Abnormal R-wave progression, early transition Probable anteroseptal infarct, old Borderline repolarization abnormality Confirmed by Pricilla Loveless [phone]) on 11/10/2020 10:43:34 AM   Radiology CT Head Wo Contrast  Result Date: 11/10/2020 CLINICAL DATA:  Vomiting and altered mental status EXAM: CT HEAD WITHOUT CONTRAST TECHNIQUE: Contiguous axial images were obtained from the base of the skull through the vertex without intravenous contrast. COMPARISON:  08/14/2020 FINDINGS: Brain: No evidence of acute infarction, hemorrhage, hydrocephalus, extra-axial collection or mass lesion/mass effect. Vascular: No hyperdense vessel or unexpected calcification. Skull: Normal. Negative for fracture or focal lesion. Sinuses/Orbits: No acute finding. Other: None. IMPRESSION: No acute intracranial abnormality noted. Electronically Signed   By: Alcide Clever M.D.   On: 11/10/2020 14:05   DG Chest Port 1 View  Result Date: 11/10/2020 CLINICAL DATA:  Altered level of consciousness, weakness, chest pain, abdominal pain, nausea, vomiting EXAM: PORTABLE CHEST 1 VIEW COMPARISON:  Portable exam 1008 hours compared to 09/28/2020 FINDINGS: Rotated to the LEFT. Upper normal heart size. Mediastinal contours and pulmonary vascularity normal. Bronchitic changes with minimal scarring at lingula. No pulmonary infiltrate, pleural effusion or pneumothorax. Bones demineralized. IMPRESSION: Bronchitic changes without infiltrate.  Electronically Signed   By: Ulyses Southward M.D.   On: 11/10/2020 10:57    Procedures Procedures   CRITICAL CARE Performed by: Burgess Amor Total critical care time: 50 minutes Critical care time was exclusive of separately billable procedures and treating other patients. Critical care was necessary to treat or prevent imminent or life-threatening deterioration. Critical care was time spent personally by me on the following activities: development of treatment plan with patient and/or surrogate as well as nursing, discussions with consultants, evaluation of patient's response to treatment, examination of patient, obtaining history from patient or surrogate, ordering and performing treatments and interventions, ordering and review of laboratory studies, ordering and review of radiographic studies, pulse oximetry and re-evaluation of patient's condition.   Medications Ordered in ED Medications  lactated ringers infusion ( Intravenous New Bag/Given 11/10/20 1344)  ceFEPIme (MAXIPIME) 2 g in sodium chloride 0.9 % 100 mL IVPB (0 g Intravenous Stopped 11/10/20 1234)  lactated ringers bolus 1,000 mL (0 mLs Intravenous Stopped 11/10/20 1320)  vancomycin (VANCOREADY) IVPB 1250 mg/250 mL (0 mg Intravenous Stopped 11/10/20 1406)    ED Course  I have reviewed the triage vital signs and the nursing notes.  Pertinent labs & imaging results that were available during my care of the patient were reviewed by me and considered in my medical decision making (see chart for details).    MDM Rules/Calculators/A&P                          Patient presenting with altered mental status of unclear etiology in association with nausea vomiting and diarrhea since yesterday.  She presents with extreme anxiety, her UDS is positive for cocaine, opiates and THC.  She also has a metabolic acidosis, she was given IV fluids and added undifferentiated antibiotics after her initial lactate returned elevated at 2.8.  She had received  Zofran IV prior to arrival and did not require any additional antiemetics and was able to consume fluids and a snack without return of vomiting.  At her work-up was complete she appeared to have better mentation, she did not know she was at [facility] but after she was told the name of this hospital she was able to name the town she was located in.  She knew her date  of birth, but was still unable to recall any events from earlier today or yesterday.  She continues to be very anxious.  Repeat bmet confirms resolution of the metabolic acidosis, second lactate is normal range.  CT head obtained no acute intracranial process.  Patient with continued altered mental status of unknown clear etiology although she does have a positive UDS.  She will need admission for further evaluation and until her symptoms improve.  Discussed with Dr. Mariea ClontsEmokpae who accepts pt for admission. Final Clinical Impression(s) / ED Diagnoses Final diagnoses:  Vomiting and diarrhea  Polysubstance abuse (HCC)  Metabolic encephalopathy    Rx / DC Orders ED Discharge Orders    None       Victoriano Laindol, Julie, PA-C 11/10/20 1843    Pricilla LovelessGoldston, Scott, MD 11/13/20 [id number]

## 2020-11-11 DIAGNOSIS — F1410 Cocaine abuse, uncomplicated: Secondary | ICD-10-CM | POA: Diagnosis present

## 2020-11-11 DIAGNOSIS — T405X1A Poisoning by cocaine, accidental (unintentional), initial encounter: Secondary | ICD-10-CM | POA: Diagnosis present

## 2020-11-11 DIAGNOSIS — R9431 Abnormal electrocardiogram [ECG] [EKG]: Secondary | ICD-10-CM | POA: Diagnosis not present

## 2020-11-11 DIAGNOSIS — F1210 Cannabis abuse, uncomplicated: Secondary | ICD-10-CM | POA: Diagnosis present

## 2020-11-11 DIAGNOSIS — F1310 Sedative, hypnotic or anxiolytic abuse, uncomplicated: Secondary | ICD-10-CM | POA: Diagnosis present

## 2020-11-11 DIAGNOSIS — E872 Acidosis: Secondary | ICD-10-CM | POA: Diagnosis present

## 2020-11-11 DIAGNOSIS — T40711A Poisoning by cannabis, accidental (unintentional), initial encounter: Secondary | ICD-10-CM | POA: Diagnosis present

## 2020-11-11 DIAGNOSIS — I493 Ventricular premature depolarization: Secondary | ICD-10-CM | POA: Diagnosis present

## 2020-11-11 DIAGNOSIS — G928 Other toxic encephalopathy: Secondary | ICD-10-CM | POA: Diagnosis present

## 2020-11-11 DIAGNOSIS — G9340 Encephalopathy, unspecified: Secondary | ICD-10-CM | POA: Diagnosis present

## 2020-11-11 DIAGNOSIS — I471 Supraventricular tachycardia: Secondary | ICD-10-CM | POA: Diagnosis present

## 2020-11-11 DIAGNOSIS — R197 Diarrhea, unspecified: Secondary | ICD-10-CM | POA: Diagnosis present

## 2020-11-11 DIAGNOSIS — J9601 Acute respiratory failure with hypoxia: Secondary | ICD-10-CM | POA: Diagnosis not present

## 2020-11-11 DIAGNOSIS — R4182 Altered mental status, unspecified: Secondary | ICD-10-CM | POA: Diagnosis not present

## 2020-11-11 DIAGNOSIS — T424X1A Poisoning by benzodiazepines, accidental (unintentional), initial encounter: Secondary | ICD-10-CM | POA: Diagnosis present

## 2020-11-11 DIAGNOSIS — E876 Hypokalemia: Secondary | ICD-10-CM | POA: Diagnosis present

## 2020-11-11 DIAGNOSIS — R112 Nausea with vomiting, unspecified: Secondary | ICD-10-CM | POA: Diagnosis present

## 2020-11-11 DIAGNOSIS — Z20822 Contact with and (suspected) exposure to covid-19: Secondary | ICD-10-CM | POA: Diagnosis present

## 2020-11-11 DIAGNOSIS — G9341 Metabolic encephalopathy: Secondary | ICD-10-CM | POA: Diagnosis present

## 2020-11-11 LAB — BASIC METABOLIC PANEL
Anion gap: 11 (ref 5–15)
BUN: 13 mg/dL (ref 8–23)
CO2: 20 mmol/L — ABNORMAL LOW (ref 22–32)
Calcium: 8.7 mg/dL — ABNORMAL LOW (ref 8.9–10.3)
Chloride: 107 mmol/L (ref 98–111)
Creatinine, Ser: 0.48 mg/dL (ref 0.44–1.00)
GFR, Estimated: >60 mL/min (ref >60)
Glucose, Bld: 101 mg/dL — ABNORMAL HIGH (ref 70–99)
Potassium: 3.4 mmol/L — ABNORMAL LOW (ref 3.5–5.1)
Sodium: 138 mmol/L (ref 135–145)

## 2020-11-11 LAB — CBC
HCT: 38.3 % (ref 36.0–46.0)
Hemoglobin: 12.7 g/dL (ref 12.0–15.0)
MCH: 31.5 pg (ref 26.0–34.0)
MCHC: 33.2 g/dL (ref 30.0–36.0)
MCV: 95 fL (ref 80.0–100.0)
Platelets: 193 10*3/uL (ref 150–400)
RBC: 4.03 MIL/uL (ref 3.87–5.11)
RDW: 14.9 % (ref 11.5–15.5)
WBC: 12.8 10*3/uL — ABNORMAL HIGH (ref 4.0–10.5)
nRBC: 0 % (ref 0.0–0.2)

## 2020-11-11 LAB — HIV ANTIBODY (ROUTINE TESTING W REFLEX): HIV Screen 4th Generation wRfx: NONREACTIVE

## 2020-11-11 MED ORDER — MAGNESIUM SULFATE 2 GM/50ML IV SOLN
2.0000 g | Freq: Once | INTRAVENOUS | Status: AC
  Administered 2020-11-11: 2 g via INTRAVENOUS
  Filled 2020-11-11: qty 50

## 2020-11-11 MED ORDER — LOPERAMIDE HCL 2 MG PO CAPS: 2.0000 mg | ORAL_CAPSULE | ORAL | Status: DC | PRN

## 2020-11-11 NOTE — Progress Notes (Signed)
Pt attempting to get OOB again without assistance, states, "I gotta go to the bathroom." Pt has had incontinent yellow stool in bed. Pt assisted to bathroom where she had more stool in toilet. Pt moaning, complaining of pain in rectum. Assisted pt to wipe, visual inspection of anus/rectal area shows no rash, reddness or external hemorrhoid. Pt assisted back to bed (unsteady gait). Pt asked for and was given cup of gingerale. Pt has very flat affect, does not make eye contact. Oriented x4 but responses not as expected, delayed with occasional rambling conversation. MD Sherryll Burger notified of current c/o and status, also that pt has taken little to no po food/fluids so far this shift.

## 2020-11-11 NOTE — Progress Notes (Signed)
PROGRESS NOTE    Helen Tapia  OXB:[id number] DOB: 31-Jul-1956 DOA: 11/10/2020 PCP: Kirstie Peri, MD   Brief Narrative:  Helen Tapia is a 65 y.o. female with medical history significant for dizziness, migraines. Patient presented to the ED via EMS with reports of vomiting since yesterday and confusion.  Patient was admitted with toxic encephalopathy secondary to polysubstance abuse with cocaine, benzos, and cannabinoids.  She was also noted to have some hypokalemia with intractable nausea and vomiting.  Assessment & Plan:   Active Problems:   AMS (altered mental status)   Toxic encephalopathy -Secondary to polysubstance abuse with cocaine benzos and cannabinoids -Head CT with no acute findings -No signs of infection currently noted -Continue to monitor  Acute hypoxemic respiratory failure -Possibly secondary to above -Currently on 2 L nasal cannula, attempt to wean -Appears to be more hypoxemic at night and may have some component of sleep apnea and will require outpatient evaluation  Intractable nausea/vomiting/diarrhea -Likely from intoxication versus gastroenteritis -Check stool GI panel -Zofran and Imodium for symptomatic treatment  Hypokalemia secondary to above -Continue repletion -Monitor labs   DVT prophylaxis: Lovenox Code Status: Full Family Communication: Patient does not want me to call Disposition Plan:  Status is: Observation  The patient will require care spanning > 2 midnights and should be moved to inpatient because: Altered mental status, IV treatments appropriate due to intensity of illness or inability to take PO and Inpatient level of care appropriate due to severity of illness  Dispo: The patient is from: Home              Anticipated d/c is to: Home              Patient currently is not medically stable to d/c.   Difficult to place patient No  Consultants:   None  Procedures:   See below  Antimicrobials:    None   Subjective: Patient seen and evaluated today with ongoing confusion with flat affect.  She is noted to have some diarrhea and nausea.  She is not hungry and is not taking in much besides some sips of fluids.  Objective: Vitals:   11/10/20 1800 11/10/20 2042 11/11/20 0350 11/11/20 0800  BP: 139/68 134/62 129/71 (!) 143/71  Pulse: (!) 41 (!) 57 (!) 59   Resp: (!) 22 20 16    Temp:  98.6 F (37 C) 98.8 F (37.1 C) 98.1 F (36.7 C)  TempSrc:  Oral Oral Oral  SpO2: 93% 99% 99% 98%  Weight:  61.6 kg    Height:  5\' 5"  (1.651 m)      Intake/Output Summary (Last 24 hours) at 11/11/2020 1152 Last data filed at 11/11/2020 0800 Gross per 24 hour  Intake 1938.41 ml  Output 500 ml  Net 1438.41 ml   Filed Weights   11/10/20 0949 11/10/20 2042  Weight: 59 kg 61.6 kg    Examination:  General exam: Appears calm and comfortable  Respiratory system: Clear to auscultation. Respiratory effort normal.  Currently on 2 L nasal cannula oxygen Cardiovascular system: S1 & S2 heard, RRR.  Gastrointestinal system: Abdomen is soft Central nervous system: Alert and awake Extremities: No edema Skin: No significant lesions noted Psychiatry: Flat affect.    Data Reviewed: I have personally reviewed following labs and imaging studies  CBC: Recent Labs  Lab 11/10/20 1023 11/11/20 0617  WBC 12.2* 12.8*  NEUTROABS 10.7*  --   HGB 14.8 12.7  HCT 46.0 38.3  MCV 97.0 95.0  PLT 164  193   Basic Metabolic Panel: Recent Labs  Lab 11/10/20 1023 11/10/20 1652 11/11/20 0617  NA 142 141 138  K 3.3* 3.2* 3.4*  CL 107 108 107  CO2 18* 21* 20*  GLUCOSE 183* 117* 101*  BUN 15 14 13   CREATININE 0.61 0.51 0.48  CALCIUM 9.2 8.8* 8.7*  MG  --  1.6*  --    GFR: Estimated Creatinine Clearance: 63.1 mL/min (by C-G formula based on SCr of 0.48 mg/dL). Liver Function Tests: Recent Labs  Lab 11/10/20 1023  AST 30  ALT 29  ALKPHOS 78  BILITOT 0.7  PROT 7.9  ALBUMIN 4.2   Recent Labs   Lab 11/10/20 1023  LIPASE 19   Recent Labs  Lab 11/10/20 1026  AMMONIA 19   Coagulation Profile: No results for input(s): INR, PROTIME in the last 168 hours. Cardiac Enzymes: No results for input(s): CKTOTAL, CKMB, CKMBINDEX, TROPONINI in the last 168 hours. BNP (last 3 results) No results for input(s): PROBNP in the last 8760 hours. HbA1C: No results for input(s): HGBA1C in the last 72 hours. CBG: No results for input(s): GLUCAP in the last 168 hours. Lipid Profile: No results for input(s): CHOL, HDL, LDLCALC, TRIG, CHOLHDL, LDLDIRECT in the last 72 hours. Thyroid Function Tests: No results for input(s): TSH, T4TOTAL, FREET4, T3FREE, THYROIDAB in the last 72 hours. Anemia Panel: No results for input(s): VITAMINB12, FOLATE, FERRITIN, TIBC, IRON, RETICCTPCT in the last 72 hours. Sepsis Labs: Recent Labs  Lab 11/10/20 1027 11/10/20 1400  LATICACIDVEN 2.8* 1.8    Recent Results (from the past 240 hour(s))  Blood culture (routine x 2)     Status: None (Preliminary result)   Collection Time: 11/10/20 10:22 AM   Specimen: BLOOD  Result Value Ref Range Status   Specimen Description BLOOD RIGHT ANTECUBITAL  Final   Special Requests   Final    Blood Culture adequate volume BOTTLES DRAWN AEROBIC AND ANAEROBIC   Culture   Final    NO GROWTH < 24 HOURS Performed at [facility], [address], [place], [place]    Report Status PENDING  Incomplete  Blood culture (routine x 2)     Status: None (Preliminary result)   Collection Time: 11/10/20 10:23 AM   Specimen: BLOOD RIGHT HAND  Result Value Ref Range Status   Specimen Description BLOOD RIGHT HAND  Final   Special Requests   Final    Blood Culture adequate volume BOTTLES DRAWN AEROBIC AND ANAEROBIC   Culture   Final    NO GROWTH < 24 HOURS Performed at [facility], [address] [place], [place]    Report Status PENDING  Incomplete  Resp Panel by RT-PCR (Flu A&B, Covid) Nasopharyngeal Swab      Status: None   Collection Time: 11/10/20 11:36 AM   Specimen: Nasopharyngeal Swab; Nasopharyngeal(NP) swabs in vial transport medium  Result Value Ref Range Status   SARS Coronavirus 2 by RT PCR NEGATIVE NEGATIVE Final    Comment: (NOTE) SARS-CoV-2 target nucleic acids are NOT DETECTED.  The SARS-CoV-2 RNA is generally detectable in upper respiratory specimens during the acute phase of infection. The lowest concentration of SARS-CoV-2 viral copies this assay can detect is 138 copies/mL. A negative result does not preclude SARS-Cov-2 infection and should not be used as the sole basis for treatment or other patient management decisions. A negative result may occur with  improper specimen collection/handling, submission of specimen other than nasopharyngeal swab, presence of viral mutation(s) within  the areas targeted by this assay, and inadequate number of viral copies(<138 copies/mL). A negative result must be combined with clinical observations, patient history, and epidemiological information. The expected result is Negative.  Fact Sheet for Patients:  BloggerCourse.com  Fact Sheet for Healthcare Providers:  SeriousBroker.it  This test is no t yet approved or cleared by the [place] FDA and  has been authorized for detection and/or diagnosis of SARS-CoV-2 by FDA under an Emergency Use Authorization (EUA). This EUA will remain  in effect (meaning this test can be used) for the duration of the COVID-19 declaration under Section 564(b)(1) of the Act, 21 U.S.C.section 360bbb-3(b)(1), unless the authorization is terminated  or revoked sooner.       Influenza A by PCR NEGATIVE NEGATIVE Final   Influenza B by PCR NEGATIVE NEGATIVE Final    Comment: (NOTE) The Xpert Xpress SARS-CoV-2/FLU/RSV plus assay is intended as an aid in the diagnosis of influenza from Nasopharyngeal swab specimens and should not be used as a sole basis  for treatment. Nasal washings and aspirates are unacceptable for Xpert Xpress SARS-CoV-2/FLU/RSV testing.  Fact Sheet for Patients: BloggerCourse.com  Fact Sheet for Healthcare Providers: SeriousBroker.it  This test is not yet approved or cleared by the [place] FDA and has been authorized for detection and/or diagnosis of SARS-CoV-2 by FDA under an Emergency Use Authorization (EUA). This EUA will remain in effect (meaning this test can be used) for the duration of the COVID-19 declaration under Section 564(b)(1) of the Act, 21 U.S.C. section 360bbb-3(b)(1), unless the authorization is terminated or revoked.  Performed at [facility], [address], [place], [place] [address]          Radiology Studies: CT Head Wo Contrast  Result Date: 11/10/2020 CLINICAL DATA:  Vomiting and altered mental status EXAM: CT HEAD WITHOUT CONTRAST TECHNIQUE: Contiguous axial images were obtained from the base of the skull through the vertex without intravenous contrast. COMPARISON:  08/14/2020 FINDINGS: Brain: No evidence of acute infarction, hemorrhage, hydrocephalus, extra-axial collection or mass lesion/mass effect. Vascular: No hyperdense vessel or unexpected calcification. Skull: Normal. Negative for fracture or focal lesion. Sinuses/Orbits: No acute finding. Other: None. IMPRESSION: No acute intracranial abnormality noted. Electronically Signed   By: Alcide Clever M.D.   On: 11/10/2020 14:05   DG Chest Port 1 View  Result Date: 11/10/2020 CLINICAL DATA:  Altered level of consciousness, weakness, chest pain, abdominal pain, nausea, vomiting EXAM: PORTABLE CHEST 1 VIEW COMPARISON:  Portable exam 1008 hours compared to 09/28/2020 FINDINGS: Rotated to the LEFT. Upper normal heart size. Mediastinal contours and pulmonary vascularity normal. Bronchitic changes with minimal scarring at lingula. No pulmonary infiltrate, pleural effusion or  pneumothorax. Bones demineralized. IMPRESSION: Bronchitic changes without infiltrate. Electronically Signed   By: Ulyses Southward M.D.   On: 11/10/2020 10:57        Scheduled Meds: . enoxaparin (LOVENOX) injection  40 mg Subcutaneous Q24H   Continuous Infusions: . 0.9 % NaCl with KCl 40 mEq / L 100 mL/hr at 11/11/20 0911     LOS: 0 days    Time spent: 35 minutes   Makala Fetterolf Hoover Brunette, DO [facility]  If 7PM-7AM, please contact night-coverage [email or web address] 11/11/2020, 11:52 AM

## 2020-11-11 NOTE — Progress Notes (Signed)
Pt was given zofran earlier this am for c/o nausea. States nausea gone at this time. Pt can answer questions appropriately regarding name/DOB, location, family member names and her address but she is not retaining information provided regarding treatments and medications. Pt keeps trying to climb over side rails, states, "I gotta pee." Pt has purewick in place and has been advised of same multiple times. Always says, "Oh, I didn't know that, thank you." Pt's affect flat, currently refusing any po fluids. When asked if she is hurting, she says , "No, I'm fine" but when she moves in bed she grunts and moans. When asked why she is grunting and moaning, she says, "I don't know." Pt still has not memory of yesterday's events.  Bed alarm on and fall mats in place for safety.

## 2020-11-12 ENCOUNTER — Inpatient Hospital Stay (HOSPITAL_COMMUNITY): Payer: 59

## 2020-11-12 DIAGNOSIS — G9340 Encephalopathy, unspecified: Secondary | ICD-10-CM | POA: Diagnosis not present

## 2020-11-12 DIAGNOSIS — R9431 Abnormal electrocardiogram [ECG] [EKG]: Secondary | ICD-10-CM

## 2020-11-12 LAB — BASIC METABOLIC PANEL
Anion gap: 13 (ref 5–15)
BUN: 14 mg/dL (ref 8–23)
CO2: 18 mmol/L — ABNORMAL LOW (ref 22–32)
Calcium: 8.5 mg/dL — ABNORMAL LOW (ref 8.9–10.3)
Chloride: 106 mmol/L (ref 98–111)
Creatinine, Ser: 0.54 mg/dL (ref 0.44–1.00)
GFR, Estimated: >60 mL/min (ref >60)
Glucose, Bld: 77 mg/dL (ref 70–99)
Potassium: 3.7 mmol/L (ref 3.5–5.1)
Sodium: 137 mmol/L (ref 135–145)

## 2020-11-12 LAB — ECHOCARDIOGRAM COMPLETE
Area-P 1/2: 2.46 cm2
Height: 65 in
S' Lateral: 2.47 cm
Weight: 2172.85 oz

## 2020-11-12 LAB — CBC
HCT: 43 % (ref 36.0–46.0)
Hemoglobin: 14.4 g/dL (ref 12.0–15.0)
MCH: 31.4 pg (ref 26.0–34.0)
MCHC: 33.5 g/dL (ref 30.0–36.0)
MCV: 93.7 fL (ref 80.0–100.0)
Platelets: 196 10*3/uL (ref 150–400)
RBC: 4.59 MIL/uL (ref 3.87–5.11)
RDW: 14 % (ref 11.5–15.5)
WBC: 10.4 10*3/uL (ref 4.0–10.5)
nRBC: 0 % (ref 0.0–0.2)

## 2020-11-12 LAB — MAGNESIUM: Magnesium: 1.9 mg/dL (ref 1.7–2.4)

## 2020-11-12 MED ORDER — POTASSIUM CHLORIDE CRYS ER 20 MEQ PO TBCR
40.0000 meq | EXTENDED_RELEASE_TABLET | Freq: Once | ORAL | Status: AC
  Administered 2020-11-12: 40 meq via ORAL
  Filled 2020-11-12: qty 2

## 2020-11-12 MED ORDER — METOPROLOL TARTRATE 25 MG PO TABS
12.5000 mg | ORAL_TABLET | Freq: Two times a day (BID) | ORAL | Status: DC
Start: 2020-11-12 — End: 2020-11-13
  Administered 2020-11-12 – 2020-11-13 (×3): 12.5 mg via ORAL
  Filled 2020-11-12 (×3): qty 1

## 2020-11-12 NOTE — Progress Notes (Signed)
PROGRESS NOTE    Helen Tapia  IDP:[id number] DOB: 1956-06-07 DOA: 11/10/2020 PCP: Kirstie Peri, MD   Brief Narrative:   Helen Tapia a 65 y.o.femalewith medical history significant fordizziness, migraines. Patient [facility] with reports of vomiting since yesterday and confusion.  Patient was admitted with toxic encephalopathy secondary to polysubstance abuse with cocaine, benzos, and cannabinoids.  She was also noted to have some hypokalemia with intractable nausea and vomiting.  Assessment & Plan:   Active Problems:   AMS (altered mental status)   Acute encephalopathy   Toxic encephalopathy-ongoing -Secondary to polysubstance abuse with cocaine benzos and cannabinoids -Head CT with no acute findings -No signs of infection currently noted -Continue to monitor  Acute hypoxemic respiratory failure -Possibly secondary to above -Currently on 2 L nasal cannula, attempt to wean -Appears to be more hypoxemic at night and may have some component of sleep apnea and will require outpatient evaluation  Paroxysmal SVT -EKG with NSR with prolonged QT -Start on metoprolol 12.5 mg twice daily -Continue supplement monitor magnesium and potassium levels -2D echocardiogram ordered and pending  Intractable nausea/vomiting/diarrhea-improving -Advance diet today -Likely from intoxication versus gastroenteritis -Check stool GI panel -Zofran and Imodium for symptomatic treatment  Hypokalemia secondary to above -Improved -Continue to monitor and replete as needed   DVT prophylaxis: Lovenox Code Status: Full Family Communication: Patient does not want me to call Disposition Plan:  Status is: Inpatient  Remains inpatient appropriate because:IV treatments appropriate due to intensity of illness or inability to take PO and Inpatient level of care appropriate due to severity of illness   Dispo: The patient is from: Home              Anticipated d/c is to:  Home              Patient currently is not medically stable to d/c.   Difficult to place patient No  Consultants:   None  Procedures:   See below  Antimicrobials:   None  Subjective: Patient seen and evaluated today with improvements in nausea and vomiting noted.  Paroxysmal SVT noted this morning.  She still continues to have dyspnea on exertion and requires oxygen supplementation.  Objective: Vitals:   11/11/20 1200 11/11/20 2156 11/11/20 2239 11/12/20 0530  BP: (!) 144/85 (!) 152/74 (!) 145/87 (!) 159/86  Pulse:  62 73 62  Resp:  16 18 18   Temp: 98.5 F (36.9 C) (!) 97.4 F (36.3 C)  98.9 F (37.2 C)  TempSrc: Oral Oral  Oral  SpO2: 94% 93% 93% 95%  Weight:      Height:        Intake/Output Summary (Last 24 hours) at 11/12/2020 0955 Last data filed at 11/12/2020 0818 Gross per 24 hour  Intake 1490.86 ml  Output 450 ml  Net 1040.86 ml   Filed Weights   11/10/20 0949 11/10/20 2042  Weight: 59 kg 61.6 kg    Examination:  General exam: Appears calm and comfortable  Respiratory system: Clear to auscultation. Respiratory effort normal.  On 2 L nasal cannula Cardiovascular system: S1 & S2 heard, RRR.  Gastrointestinal system: Abdomen is soft Central nervous system: Alert and awake Extremities: No edema Skin: No significant lesions noted Psychiatry: Flat affect.    Data Reviewed: I have personally reviewed following labs and imaging studies  CBC: Recent Labs  Lab 11/10/20 1023 11/11/20 0617 11/12/20 0557  WBC 12.2* 12.8* 10.4  NEUTROABS 10.7*  --   --   HGB 14.8  12.7 14.4  HCT 46.0 38.3 43.0  MCV 97.0 95.0 93.7  PLT 164 193 196   Basic Metabolic Panel: Recent Labs  Lab 11/10/20 1023 11/10/20 1652 11/11/20 0617 11/12/20 0557  NA 142 141 138 137  K 3.3* 3.2* 3.4* 3.7  CL 107 108 107 106  CO2 18* 21* 20* 18*  GLUCOSE 183* 117* 101* 77  BUN 15 14 13 14   CREATININE 0.61 0.51 0.48 0.54  CALCIUM 9.2 8.8* 8.7* 8.5*  MG  --  1.6*  --  1.9    GFR: Estimated Creatinine Clearance: 63.1 mL/min (by C-G formula based on SCr of 0.54 mg/dL). Liver Function Tests: Recent Labs  Lab 11/10/20 1023  AST 30  ALT 29  ALKPHOS 78  BILITOT 0.7  PROT 7.9  ALBUMIN 4.2   Recent Labs  Lab 11/10/20 1023  LIPASE 19   Recent Labs  Lab 11/10/20 1026  AMMONIA 19   Coagulation Profile: No results for input(s): INR, PROTIME in the last 168 hours. Cardiac Enzymes: No results for input(s): CKTOTAL, CKMB, CKMBINDEX, TROPONINI in the last 168 hours. BNP (last 3 results) No results for input(s): PROBNP in the last 8760 hours. HbA1C: No results for input(s): HGBA1C in the last 72 hours. CBG: No results for input(s): GLUCAP in the last 168 hours. Lipid Profile: No results for input(s): CHOL, HDL, LDLCALC, TRIG, CHOLHDL, LDLDIRECT in the last 72 hours. Thyroid Function Tests: No results for input(s): TSH, T4TOTAL, FREET4, T3FREE, THYROIDAB in the last 72 hours. Anemia Panel: No results for input(s): VITAMINB12, FOLATE, FERRITIN, TIBC, IRON, RETICCTPCT in the last 72 hours. Sepsis Labs: Recent Labs  Lab 11/10/20 1027 11/10/20 1400  LATICACIDVEN 2.8* 1.8    Recent Results (from the past 240 hour(s))  Blood culture (routine x 2)     Status: None (Preliminary result)   Collection Time: 11/10/20 10:22 AM   Specimen: BLOOD  Result Value Ref Range Status   Specimen Description BLOOD RIGHT ANTECUBITAL  Final   Special Requests   Final    Blood Culture adequate volume BOTTLES DRAWN AEROBIC AND ANAEROBIC   Culture   Final    NO GROWTH 2 DAYS Performed at [facility], [address] [place], [place]    Report Status PENDING  Incomplete  Blood culture (routine x 2)     Status: None (Preliminary result)   Collection Time: 11/10/20 10:23 AM   Specimen: BLOOD RIGHT HAND  Result Value Ref Range Status   Specimen Description BLOOD RIGHT HAND  Final   Special Requests   Final    Blood Culture adequate volume BOTTLES DRAWN  AEROBIC AND ANAEROBIC   Culture   Final    NO GROWTH 2 DAYS Performed at [facility], [address] [place], [place]    Report Status PENDING  Incomplete  Resp Panel by RT-PCR (Flu A&B, Covid) Nasopharyngeal Swab     Status: None   Collection Time: 11/10/20 11:36 AM   Specimen: Nasopharyngeal Swab; Nasopharyngeal(NP) swabs in vial transport medium  Result Value Ref Range Status   SARS Coronavirus 2 by RT PCR NEGATIVE NEGATIVE Final    Comment: (NOTE) SARS-CoV-2 target nucleic acids are NOT DETECTED.  The SARS-CoV-2 RNA is generally detectable in upper respiratory specimens during the acute phase of infection. The lowest concentration of SARS-CoV-2 viral copies this assay can detect is 138 copies/mL. A negative result does not preclude SARS-Cov-2 infection and should not be used as the sole basis for treatment or other  patient management decisions. A negative result may occur with  improper specimen collection/handling, submission of specimen other than nasopharyngeal swab, presence of viral mutation(s) within the areas targeted by this assay, and inadequate number of viral copies(<138 copies/mL). A negative result must be combined with clinical observations, patient history, and epidemiological information. The expected result is Negative.  Fact Sheet for Patients:  BloggerCourse.com  Fact Sheet for Healthcare Providers:  SeriousBroker.it  This test is no t yet approved or cleared by the [place] FDA and  has been authorized for detection and/or diagnosis of SARS-CoV-2 by FDA under an Emergency Use Authorization (EUA). This EUA will remain  in effect (meaning this test can be used) for the duration of the COVID-19 declaration under Section 564(b)(1) of the Act, 21 U.S.C.section 360bbb-3(b)(1), unless the authorization is terminated  or revoked sooner.       Influenza A by PCR NEGATIVE NEGATIVE Final    Influenza B by PCR NEGATIVE NEGATIVE Final    Comment: (NOTE) The Xpert Xpress SARS-CoV-2/FLU/RSV plus assay is intended as an aid in the diagnosis of influenza from Nasopharyngeal swab specimens and should not be used as a sole basis for treatment. Nasal washings and aspirates are unacceptable for Xpert Xpress SARS-CoV-2/FLU/RSV testing.  Fact Sheet for Patients: BloggerCourse.com  Fact Sheet for Healthcare Providers: SeriousBroker.it  This test is not yet approved or cleared by the [place] FDA and has been authorized for detection and/or diagnosis of SARS-CoV-2 by FDA under an Emergency Use Authorization (EUA). This EUA will remain in effect (meaning this test can be used) for the duration of the COVID-19 declaration under Section 564(b)(1) of the Act, 21 U.S.C. section 360bbb-3(b)(1), unless the authorization is terminated or revoked.  Performed at [facility], [address] [place], [place] [address]          Radiology Studies: CT Head Wo Contrast  Result Date: 11/10/2020 CLINICAL DATA:  Vomiting and altered mental status EXAM: CT HEAD WITHOUT CONTRAST TECHNIQUE: Contiguous axial images were obtained from the base of the skull through the vertex without intravenous contrast. COMPARISON:  08/14/2020 FINDINGS: Brain: No evidence of acute infarction, hemorrhage, hydrocephalus, extra-axial collection or mass lesion/mass effect. Vascular: No hyperdense vessel or unexpected calcification. Skull: Normal. Negative for fracture or focal lesion. Sinuses/Orbits: No acute finding. Other: None. IMPRESSION: No acute intracranial abnormality noted. Electronically Signed   By: Alcide Clever M.D.   On: 11/10/2020 14:05   DG Chest Port 1 View  Result Date: 11/10/2020 CLINICAL DATA:  Altered level of consciousness, weakness, chest pain, abdominal pain, nausea, vomiting EXAM: PORTABLE CHEST 1 VIEW COMPARISON:  Portable exam 1008 hours  compared to 09/28/2020 FINDINGS: Rotated to the LEFT. Upper normal heart size. Mediastinal contours and pulmonary vascularity normal. Bronchitic changes with minimal scarring at lingula. No pulmonary infiltrate, pleural effusion or pneumothorax. Bones demineralized. IMPRESSION: Bronchitic changes without infiltrate. Electronically Signed   By: Ulyses Southward M.D.   On: 11/10/2020 10:57        Scheduled Meds:  enoxaparin (LOVENOX) injection  40 mg Subcutaneous Q24H   metoprolol tartrate  12.5 mg Oral BID    LOS: 1 day    Time spent: 35 minutes    Jaimeson Gopal Hoover Brunette, DO [facility]  If 7PM-7AM, please contact night-coverage [email or web address] 11/12/2020, 9:55 AM

## 2020-11-12 NOTE — Progress Notes (Addendum)
RN informed by [employer] monitoring of Bigeminy. Patient's tele reviewed.non sustained bigeminy noted. Patient denies any chestpain or any discomfort. VS checked 145/87, 73 92 % at RA. MD made aware. Continue to monitor per MD. Rechecked oxygen at saturation @RA  93%

## 2020-11-12 NOTE — Progress Notes (Signed)
*  PRELIMINARY RESULTS* Echocardiogram 2D Echocardiogram has been performed.  Jeryl Columbia 11/12/2020, 9:32 AM

## 2020-11-12 NOTE — Progress Notes (Signed)
**Note De-identified Kylena Mole Obfuscation** EKG placed in patient chart

## 2020-11-13 DIAGNOSIS — G9340 Encephalopathy, unspecified: Secondary | ICD-10-CM | POA: Diagnosis not present

## 2020-11-13 LAB — CBC
HCT: 47.2 % — ABNORMAL HIGH (ref 36.0–46.0)
Hemoglobin: 15.7 g/dL — ABNORMAL HIGH (ref 12.0–15.0)
MCH: 31.4 pg (ref 26.0–34.0)
MCHC: 33.3 g/dL (ref 30.0–36.0)
MCV: 94.4 fL (ref 80.0–100.0)
Platelets: 210 10*3/uL (ref 150–400)
RBC: 5 MIL/uL (ref 3.87–5.11)
RDW: 14 % (ref 11.5–15.5)
WBC: 9.3 10*3/uL (ref 4.0–10.5)
nRBC: 0 % (ref 0.0–0.2)

## 2020-11-13 LAB — MAGNESIUM: Magnesium: 2.1 mg/dL (ref 1.7–2.4)

## 2020-11-13 LAB — BASIC METABOLIC PANEL
Anion gap: 16 — ABNORMAL HIGH (ref 5–15)
BUN: 18 mg/dL (ref 8–23)
CO2: 17 mmol/L — ABNORMAL LOW (ref 22–32)
Calcium: 9.1 mg/dL (ref 8.9–10.3)
Chloride: 103 mmol/L (ref 98–111)
Creatinine, Ser: 0.49 mg/dL (ref 0.44–1.00)
GFR, Estimated: >60 mL/min (ref >60)
Glucose, Bld: 77 mg/dL (ref 70–99)
Potassium: 4 mmol/L (ref 3.5–5.1)
Sodium: 136 mmol/L (ref 135–145)

## 2020-11-13 MED ORDER — ONDANSETRON HCL 4 MG PO TABS: 4.0000 mg | ORAL_TABLET | Freq: Four times a day (QID) | ORAL | 0 refills | Status: DC | PRN

## 2020-11-13 MED ORDER — METOPROLOL TARTRATE 25 MG PO TABS: 12.5000 mg | ORAL_TABLET | Freq: Two times a day (BID) | ORAL | 2 refills | Status: AC

## 2020-11-13 MED ORDER — LOPERAMIDE HCL 2 MG PO CAPS: 2.0000 mg | ORAL_CAPSULE | ORAL | 0 refills | Status: AC | PRN

## 2020-11-13 NOTE — Discharge Summary (Signed)
Physician Discharge Summary  Camelle Henkels ZOX:[id number] DOB: Feb 24, 1956 DOA: 11/10/2020  PCP: Kirstie Peri, MD  Admit date: 11/10/2020  Discharge date: 11/13/2020  Admitted From:Home  Disposition:  Home  Recommendations for Outpatient Follow-up:  1. Follow up with PCP in 1-2 weeks 2. Counseled on avoidance of illicit drug use to include cocaine, marijuana, and unprescribed benzodiazepines 3. Continue home metoprolol 12.5 mg twice daily to assist with heart rate control and noted PVCs and bigeminy  Home Health: None  Equipment/Devices: None  Discharge Condition:Stable  CODE STATUS: Full  Diet recommendation: Heart Healthy  Brief/Interim Summary: Noah Pelaez a 65 y.o.femalewith medical history significant fordizziness, migraines who presented [facility] the [facility] with reports of vomiting since yesterday and confusion.Patient was admitted with toxic encephalopathy secondary to polysubstance abuse with cocaine, benzos, and cannabinoids. She was also noted to have some hypokalemia with intractable nausea and vomiting.  At this point she has had resolution of her symptoms and is ready for discharge.  She was noted to have some PVCs and bigeminy on telemetry monitoring for which she was started on some metoprolol with improvement in her arrhythmias.  2D echocardiogram was also performed with LVEF 60-65% and no other acute findings noted.  I have counseled her extensively on avoidance of other illicit drugs and she states that she will "never use them again."  She will remain on metoprolol as prescribed and follow-up with her PCP as recommended.  No other acute events during this admission.  Discharge Diagnoses:  Active Problems:   AMS (altered mental status)   Acute encephalopathy  Principal discharge diagnosis: Acute toxic encephalopathy secondary to polysubstance abuse with cocaine, marijuana, and benzodiazepines.  Discharge Instructions  Discharge Instructions    Diet - low  sodium heart healthy   Complete by: As directed    Increase activity slowly   Complete by: As directed      Allergies as of 11/13/2020   No Known Allergies     Medication List    TAKE these medications   calcium carbonate 600 MG Tabs tablet Commonly known as: OS-CAL Take 600 mg by mouth daily after breakfast.   dimenhyDRINATE 50 MG tablet Commonly known as: DRAMAMINE Take 50 mg by mouth every 8 (eight) hours as needed. Dizziness   escitalopram 20 MG tablet Commonly known as: LEXAPRO Take 20 mg by mouth daily.   gabapentin 300 MG capsule Commonly known as: NEURONTIN Take 300 mg by mouth 3 (three) times daily.   loperamide 2 MG capsule Commonly known as: IMODIUM Take 1 capsule (2 mg total) by mouth as needed for diarrhea or loose stools.   metoprolol tartrate 25 MG tablet Commonly known as: LOPRESSOR Take 0.5 tablets (12.5 mg total) by mouth 2 (two) times daily.   ondansetron 4 MG tablet Commonly known as: ZOFRAN Take 1 tablet (4 mg total) by mouth every 6 (six) hours as needed for nausea.   rosuvastatin 10 MG tablet Commonly known as: CRESTOR Take 10 mg by mouth daily.   tetrahydrozoline-zinc 0.05-0.25 % ophthalmic solution Commonly known as: VISINE-AC Place 2 drops into both eyes 3 (three) times daily as needed. Dry Eyes   traZODone 50 MG tablet Commonly known as: DESYREL Take 50 mg by mouth at bedtime.       Follow-up Information    Kirstie Peri, MD. Schedule an appointment as soon as possible for a visit in 1 week(s).   Specialty: Internal Medicine Contact information: [address] [place] [address] [phone]  No Known Allergies  Consultations:  None   Procedures/Studies: CT Head Wo Contrast  Result Date: 11/10/2020 CLINICAL DATA:  Vomiting and altered mental status EXAM: CT HEAD WITHOUT CONTRAST TECHNIQUE: Contiguous axial images were obtained from the base of the skull through the vertex without intravenous contrast.  COMPARISON:  08/14/2020 FINDINGS: Brain: No evidence of acute infarction, hemorrhage, hydrocephalus, extra-axial collection or mass lesion/mass effect. Vascular: No hyperdense vessel or unexpected calcification. Skull: Normal. Negative for fracture or focal lesion. Sinuses/Orbits: No acute finding. Other: None. IMPRESSION: No acute intracranial abnormality noted. Electronically Signed   By: Alcide Clever M.D.   On: 11/10/2020 14:05   DG Chest Port 1 View  Result Date: 11/10/2020 CLINICAL DATA:  Altered level of consciousness, weakness, chest pain, abdominal pain, nausea, vomiting EXAM: PORTABLE CHEST 1 VIEW COMPARISON:  Portable exam 1008 hours compared to 09/28/2020 FINDINGS: Rotated to the LEFT. Upper normal heart size. Mediastinal contours and pulmonary vascularity normal. Bronchitic changes with minimal scarring at lingula. No pulmonary infiltrate, pleural effusion or pneumothorax. Bones demineralized. IMPRESSION: Bronchitic changes without infiltrate. Electronically Signed   By: Ulyses Southward M.D.   On: 11/10/2020 10:57   ECHOCARDIOGRAM COMPLETE  Result Date: 11/12/2020    ECHOCARDIOGRAM REPORT   Patient Name:   COURTENAY HIRTH Date of Exam: 11/12/2020 Medical Rec #:  [id number]    Height:       65.0 in Accession #:    [id number]   Weight:       135.8 lb Date of Birth:  Mar 21, 1956    BSA:          1.678 m Patient Age:    65 years     BP:           159/86 mmHg Patient Gender: F            HR:           62 bpm. [facility] Procedure: 2D Echo Indications:    Abnormal ECG R94.31  History:        Patient has no prior history of Echocardiogram examinations.                 Risk Factors:Non-Smoker. AMS, Polysubstance Abuse.  Sonographer:    Jeryl Columbia RDCS (AE) Referring Phys: [phone] [facility] IMPRESSIONS  1. Left ventricular ejection fraction, by estimation, is 60 to 65%. The left ventricle has normal function. The left ventricle has no regional wall motion abnormalities. Left ventricular  diastolic parameters were normal.  2. Right ventricular systolic function is normal. The right ventricular size is normal.  3. The mitral valve is normal in structure. No evidence of mitral valve regurgitation.  4. The aortic valve is normal in structure. Aortic valve regurgitation is not visualized. Mild aortic valve sclerosis is present, with no evidence of aortic valve stenosis.  5. The inferior vena cava is normal in size with greater than 50% respiratory variability, suggesting right atrial pressure of 3 mmHg. FINDINGS  Left Ventricle: Left ventricular ejection fraction, by estimation, is 60 to 65%. The left ventricle has normal function. The left ventricle has no regional wall motion abnormalities. The left ventricular internal cavity size was normal in size. There is  no left ventricular hypertrophy. Left ventricular diastolic parameters were normal. Right Ventricle: The right ventricular size is normal. No increase in right ventricular wall thickness. Right ventricular systolic function is normal. Left Atrium: Left atrial size was normal in size. Right Atrium: Right atrial size was normal  in size. Pericardium: There is no evidence of pericardial effusion. Mitral Valve: The mitral valve is normal in structure. No evidence of mitral valve regurgitation. Tricuspid Valve: The tricuspid valve is normal in structure. Tricuspid valve regurgitation is mild. Aortic Valve: The aortic valve is normal in structure. Aortic valve regurgitation is not visualized. Mild aortic valve sclerosis is present, with no evidence of aortic valve stenosis. Pulmonic Valve: The pulmonic valve was normal in structure. Pulmonic valve regurgitation is not visualized. Aorta: The aortic root is normal in size and structure. Venous: The inferior vena cava is normal in size with greater than 50% respiratory variability, suggesting right atrial pressure of 3 mmHg. IAS/Shunts: The interatrial septum was not assessed.  LEFT VENTRICLE PLAX 2D  LVIDd:         3.31 cm  Diastology LVIDs:         2.47 cm  LV e' medial:    5.44 cm/s LV PW:         1.03 cm  LV E/e' medial:  13.6 LV IVS:        0.92 cm  LV e' lateral:   9.03 cm/s LVOT diam:     2.00 cm  LV E/e' lateral: 8.2 LVOT Area:     3.14 cm  RIGHT VENTRICLE RV S prime:     25.90 cm/s TAPSE (M-mode): 3.0 cm LEFT ATRIUM             Index       RIGHT ATRIUM           Index LA diam:        2.20 cm 1.31 cm/m  RA Area:     13.10 cm LA Vol (A2C):   37.2 ml 22.17 ml/m RA Volume:   33.00 ml  19.67 ml/m LA Vol (A4C):   22.3 ml 13.29 ml/m LA Biplane Vol: 29.2 ml 17.40 ml/m   AORTA Ao Root diam: 3.10 cm MITRAL VALVE MV Area (PHT): 2.46 cm    SHUNTS MV Decel Time: 309 msec    Systemic Diam: 2.00 cm MV E velocity: 73.80 cm/s MV A velocity: 53.60 cm/s MV E/A ratio:  1.38 Dietrich PatesPaula Ross MD Electronically signed by Dietrich PatesPaula Ross MD Signature Date/Time: 11/12/2020/2:20:59 PM    Final       Discharge Exam: Vitals:   11/13/20 0654 11/13/20 0844  BP: 135/76   Pulse: (!) 56 64  Resp: 20   Temp: 98.4 F (36.9 C)   SpO2: 95%    Vitals:   11/12/20 1309 11/12/20 2055 11/13/20 0654 11/13/20 0844  BP: (!) 156/77 (!) 157/80 135/76   Pulse: (!) 50 60 (!) 56 64  Resp: 20 20 20    Temp: 98.8 F (37.1 C) 98.8 F (37.1 C) 98.4 F (36.9 C)   TempSrc: Oral Oral Oral   SpO2: 95% 95% 95%   Weight:      Height:        General: Pt is alert, awake, not in acute distress Cardiovascular: RRR, S1/S2 +, no rubs, no gallops Respiratory: CTA bilaterally, no wheezing, no rhonchi Abdominal: Soft, NT, ND, bowel sounds + Extremities: no edema, no cyanosis    The results of significant diagnostics from this hospitalization (including imaging, microbiology, ancillary and laboratory) are listed below for reference.     Microbiology: Recent Results (from the past 240 hour(s))  Blood culture (routine x 2)     Status: None (Preliminary result)   Collection Time: 11/10/20 10:22 AM   Specimen: BLOOD  Result  Value Ref  Range Status   Specimen Description BLOOD RIGHT ANTECUBITAL  Final   Special Requests   Final    Blood Culture adequate volume BOTTLES DRAWN AEROBIC AND ANAEROBIC   Culture   Final    NO GROWTH 3 DAYS Performed at [facility], [address], [place], [place] [address]    Report Status PENDING  Incomplete  Blood culture (routine x 2)     Status: None (Preliminary result)   Collection Time: 11/10/20 10:23 AM   Specimen: BLOOD RIGHT HAND  Result Value Ref Range Status   Specimen Description BLOOD RIGHT HAND  Final   Special Requests   Final    Blood Culture adequate volume BOTTLES DRAWN AEROBIC AND ANAEROBIC   Culture   Final    NO GROWTH 3 DAYS Performed at [facility], [address] [place], [place] [address]    Report Status PENDING  Incomplete  Resp Panel by RT-PCR (Flu A&B, Covid) Nasopharyngeal Swab     Status: None   Collection Time: 11/10/20 11:36 AM   Specimen: Nasopharyngeal Swab; Nasopharyngeal(NP) swabs in vial transport medium  Result Value Ref Range Status   SARS Coronavirus 2 by RT PCR NEGATIVE NEGATIVE Final    Comment: (NOTE) SARS-CoV-2 target nucleic acids are NOT DETECTED.  The SARS-CoV-2 RNA is generally detectable in upper respiratory specimens during the acute phase of infection. The lowest concentration of SARS-CoV-2 viral copies this assay can detect is 138 copies/mL. A negative result does not preclude SARS-Cov-2 infection and should not be used as the sole basis for treatment or other patient management decisions. A negative result may occur with  improper specimen collection/handling, submission of specimen other than nasopharyngeal swab, presence of viral mutation(s) within the areas targeted by this assay, and inadequate number of viral copies(<138 copies/mL). A negative result must be combined with clinical observations, patient history, and epidemiological information. The expected result is Negative.  Fact Sheet for Patients:   BloggerCourse.com  Fact Sheet for Healthcare Providers:  SeriousBroker.it  This test is no t yet approved or cleared by the [place] FDA and  has been authorized for detection and/or diagnosis of SARS-CoV-2 by FDA under an Emergency Use Authorization (EUA). This EUA will remain  in effect (meaning this test can be used) for the duration of the COVID-19 declaration under Section 564(b)(1) of the Act, 21 U.S.C.section 360bbb-3(b)(1), unless the authorization is terminated  or revoked sooner.       Influenza A by PCR NEGATIVE NEGATIVE Final   Influenza B by PCR NEGATIVE NEGATIVE Final    Comment: (NOTE) The Xpert Xpress SARS-CoV-2/FLU/RSV plus assay is intended as an aid in the diagnosis of influenza from Nasopharyngeal swab specimens and should not be used as a sole basis for treatment. Nasal washings and aspirates are unacceptable for Xpert Xpress SARS-CoV-2/FLU/RSV testing.  Fact Sheet for Patients: BloggerCourse.com  Fact Sheet for Healthcare Providers: SeriousBroker.it  This test is not yet approved or cleared by the [place] FDA and has been authorized for detection and/or diagnosis of SARS-CoV-2 by FDA under an Emergency Use Authorization (EUA). This EUA will remain in effect (meaning this test can be used) for the duration of the COVID-19 declaration under Section 564(b)(1) of the Act, 21 U.S.C. section 360bbb-3(b)(1), unless the authorization is terminated or revoked.  Performed at [facility], [address], [place], [place] [address]      Labs: BNP (last 3 results) No results for input(s): BNP in the last 8760 hours.  Basic Metabolic Panel: Recent Labs  Lab 11/10/20 1023 11/10/20 1652 11/11/20 0617 11/12/20 0557 11/13/20 0438  NA 142 141 138 137 136  K 3.3* 3.2* 3.4* 3.7 4.0  CL 107 108 107 106 103  CO2 18* 21* 20* 18* 17*  GLUCOSE 183* 117*  101* 77 77  BUN CREATININE 0.61 0.51 0.48 0.54 0.49  CALCIUM 9.2 8.8* 8.7* 8.5* 9.1  MG  --  1.6*  --  1.9 2.1   Liver Function Tests: Recent Labs  Lab 11/10/20 1023  AST 30  ALT 29  ALKPHOS 78  BILITOT 0.7  PROT 7.9  ALBUMIN 4.2   Recent Labs  Lab 11/10/20 1023  LIPASE 19   Recent Labs  Lab 11/10/20 1026  AMMONIA 19   CBC: Recent Labs  Lab 11/10/20 1023 11/11/20 0617 11/12/20 0557 11/13/20 0438  WBC 12.2* 12.8* 10.4 9.3  NEUTROABS 10.7*  --   --   --   HGB 14.8 12.7 14.4 15.7*  HCT 46.0 38.3 43.0 47.2*  MCV 97.0 95.0 93.7 94.4  PLT 164 193 196 210   Cardiac Enzymes: No results for input(s): CKTOTAL, CKMB, CKMBINDEX, TROPONINI in the last 168 hours. BNP: Invalid input(s): POCBNP CBG: No results for input(s): GLUCAP in the last 168 hours. D-Dimer No results for input(s): DDIMER in the last 72 hours. Hgb A1c No results for input(s): HGBA1C in the last 72 hours. Lipid Profile No results for input(s): CHOL, HDL, LDLCALC, TRIG, CHOLHDL, LDLDIRECT in the last 72 hours. Thyroid function studies No results for input(s): TSH, T4TOTAL, T3FREE, THYROIDAB in the last 72 hours.  Invalid input(s): FREET3 Anemia work up No results for input(s): VITAMINB12, FOLATE, FERRITIN, TIBC, IRON, RETICCTPCT in the last 72 hours. Urinalysis    Component Value Date/Time   COLORURINE YELLOW 11/10/2020 0958   APPEARANCEUR CLEAR 11/10/2020 0958   LABSPEC 1.023 11/10/2020 0958   PHURINE 6.0 11/10/2020 0958   GLUCOSEU NEGATIVE 11/10/2020 0958   HGBUR NEGATIVE 11/10/2020 0958   BILIRUBINUR NEGATIVE 11/10/2020 0958   KETONESUR 80 (A) 11/10/2020 0958   PROTEINUR 100 (A) 11/10/2020 0958   UROBILINOGEN 0.2 10/04/2011 1502   NITRITE NEGATIVE 11/10/2020 0958   LEUKOCYTESUR NEGATIVE 11/10/2020 0958   Sepsis Labs Invalid input(s): PROCALCITONIN,  WBC,  LACTICIDVEN Microbiology Recent Results (from the past 240 hour(s))  Blood culture (routine x 2)     Status: None  (Preliminary result)   Collection Time: 11/10/20 10:22 AM   Specimen: BLOOD  Result Value Ref Range Status   Specimen Description BLOOD RIGHT ANTECUBITAL  Final   Special Requests   Final    Blood Culture adequate volume BOTTLES DRAWN AEROBIC AND ANAEROBIC   Culture   Final    NO GROWTH 3 DAYS Performed at [facility], [address] [place], [place] [address]    Report Status PENDING  Incomplete  Blood culture (routine x 2)     Status: None (Preliminary result)   Collection Time: 11/10/20 10:23 AM   Specimen: BLOOD RIGHT HAND  Result Value Ref Range Status   Specimen Description BLOOD RIGHT HAND  Final   Special Requests   Final    Blood Culture adequate volume BOTTLES DRAWN AEROBIC AND ANAEROBIC   Culture   Final    NO GROWTH 3 DAYS Performed at [facility], [address], [place], [place] [address]    Report Status PENDING  Incomplete  Resp Panel by RT-PCR (Flu A&B, Covid) Nasopharyngeal Swab  Status: None   Collection Time: 11/10/20 11:36 AM   Specimen: Nasopharyngeal Swab; Nasopharyngeal(NP) swabs in vial transport medium  Result Value Ref Range Status   SARS Coronavirus 2 by RT PCR NEGATIVE NEGATIVE Final    Comment: (NOTE) SARS-CoV-2 target nucleic acids are NOT DETECTED.  The SARS-CoV-2 RNA is generally detectable in upper respiratory specimens during the acute phase of infection. The lowest concentration of SARS-CoV-2 viral copies this assay can detect is 138 copies/mL. A negative result does not preclude SARS-Cov-2 infection and should not be used as the sole basis for treatment or other patient management decisions. A negative result may occur with  improper specimen collection/handling, submission of specimen other than nasopharyngeal swab, presence of viral mutation(s) within the areas targeted by this assay, and inadequate number of viral copies(<138 copies/mL). A negative result must be combined with clinical observations, patient history, and  epidemiological information. The expected result is Negative.  Fact Sheet for Patients:  BloggerCourse.com  Fact Sheet for Healthcare Providers:  SeriousBroker.it  This test is no t yet approved or cleared by the [place] FDA and  has been authorized for detection and/or diagnosis of SARS-CoV-2 by FDA under an Emergency Use Authorization (EUA). This EUA will remain  in effect (meaning this test can be used) for the duration of the COVID-19 declaration under Section 564(b)(1) of the Act, 21 U.S.C.section 360bbb-3(b)(1), unless the authorization is terminated  or revoked sooner.       Influenza A by PCR NEGATIVE NEGATIVE Final   Influenza B by PCR NEGATIVE NEGATIVE Final    Comment: (NOTE) The Xpert Xpress SARS-CoV-2/FLU/RSV plus assay is intended as an aid in the diagnosis of influenza from Nasopharyngeal swab specimens and should not be used as a sole basis for treatment. Nasal washings and aspirates are unacceptable for Xpert Xpress SARS-CoV-2/FLU/RSV testing.  Fact Sheet for Patients: BloggerCourse.com  Fact Sheet for Healthcare Providers: SeriousBroker.it  This test is not yet approved or cleared by the [place] FDA and has been authorized for detection and/or diagnosis of SARS-CoV-2 by FDA under an Emergency Use Authorization (EUA). This EUA will remain in effect (meaning this test can be used) for the duration of the COVID-19 declaration under Section 564(b)(1) of the Act, 21 U.S.C. section 360bbb-3(b)(1), unless the authorization is terminated or revoked.  Performed at [facility], [address] [place], [place] [address]      Time coordinating discharge: 35 minutes  SIGNED:   Erick Blinks, DO [facility] 11/13/2020, 9:05 AM  If 7PM-7AM, please contact night-coverage [email or web address]

## 2020-11-13 NOTE — Progress Notes (Signed)
Pt has discharge orders, discharge teaching given and no further questions at this time. Pt removed IV herself, this nurse checked catheter from IV and it was intact, no trauma to insertion site noted. Pt's boyfriend is coming to pick up pt. Wheeled down to lobby via staff.

## 2020-11-15 LAB — CULTURE, BLOOD (ROUTINE X 2)
Culture: NO GROWTH
Culture: NO GROWTH
Special Requests: ADEQUATE
Special Requests: ADEQUATE

## 2020-11-19 ENCOUNTER — Other Ambulatory Visit: Payer: Self-pay | Admitting: Internal Medicine

## 2020-11-19 DIAGNOSIS — Z1231 Encounter for screening mammogram for malignant neoplasm of breast: Secondary | ICD-10-CM

## 2020-11-23 ENCOUNTER — Other Ambulatory Visit: Payer: Self-pay

## 2020-11-23 ENCOUNTER — Ambulatory Visit
Admission: RE | Admit: 2020-11-23 | Discharge: 2020-11-23 | Disposition: A | Discharge status: Home / Self Care | Payer: 59 | Source: Ambulatory Visit | Attending: Internal Medicine | Admitting: Internal Medicine

## 2020-11-23 DIAGNOSIS — Z1231 Encounter for screening mammogram for malignant neoplasm of breast: Secondary | ICD-10-CM

## 2020-11-27 ENCOUNTER — Other Ambulatory Visit: Payer: Self-pay | Admitting: Internal Medicine

## 2020-11-27 DIAGNOSIS — R928 Other abnormal and inconclusive findings on diagnostic imaging of breast: Secondary | ICD-10-CM

## 2020-12-18 ENCOUNTER — Other Ambulatory Visit: Payer: Self-pay

## 2020-12-18 ENCOUNTER — Ambulatory Visit
Admission: RE | Admit: 2020-12-18 | Discharge: 2020-12-18 | Disposition: A | Discharge status: Home / Self Care | Payer: 59 | Source: Ambulatory Visit | Attending: Internal Medicine | Admitting: Internal Medicine

## 2020-12-18 DIAGNOSIS — R928 Other abnormal and inconclusive findings on diagnostic imaging of breast: Secondary | ICD-10-CM

## 2021-03-18 ENCOUNTER — Encounter: Payer: Self-pay | Admitting: Internal Medicine

## 2021-05-05 ENCOUNTER — Other Ambulatory Visit: Payer: Self-pay

## 2021-05-05 ENCOUNTER — Telehealth: Payer: Self-pay

## 2021-05-05 ENCOUNTER — Ambulatory Visit (INDEPENDENT_AMBULATORY_CARE_PROVIDER_SITE_OTHER): Payer: 59 | Admitting: Internal Medicine

## 2021-05-05 ENCOUNTER — Encounter: Payer: Self-pay | Admitting: Internal Medicine

## 2021-05-05 VITALS — BP 97/67 | HR 58 | Temp 97.5°F | Ht 65.0 in | Wt 138.4 lb

## 2021-05-05 DIAGNOSIS — R1115 Cyclical vomiting syndrome unrelated to migraine: Secondary | ICD-10-CM | POA: Diagnosis not present

## 2021-05-05 DIAGNOSIS — G8929 Other chronic pain: Secondary | ICD-10-CM

## 2021-05-05 DIAGNOSIS — Z1211 Encounter for screening for malignant neoplasm of colon: Secondary | ICD-10-CM

## 2021-05-05 DIAGNOSIS — R1013 Epigastric pain: Secondary | ICD-10-CM | POA: Diagnosis not present

## 2021-05-05 MED ORDER — ONDANSETRON 4 MG PO TBDP: 4.0000 mg | ORAL_TABLET | Freq: Three times a day (TID) | ORAL | 2 refills | Status: AC | PRN

## 2021-05-05 MED ORDER — CLENPIQ 10-3.5-12 MG-GM -GM/160ML PO SOLN: 1.0000 | Freq: Once | ORAL | 0 refills | Status: AC

## 2021-05-05 NOTE — Progress Notes (Signed)
Primary Care Physician:  Kirstie Peri, MD Primary Gastroenterologist:  Dr. Marletta Lor  Chief Complaint  Patient presents with   Vomiting    Happens about every 1-2 months, lasts for about a day and then goes away. Has dizziness when this happens   Diarrhea    Couple times per week, no blood    HPI:   Helen Tapia is a 65 y.o. female who presents to the clinic today by referral from her PCP Dr. Clelia Croft for evaluation.  Patient states for over a year she has had recurrent episodes of nausea and vomiting.  States these episodes happen once every 1 to 2 months and she will have vomiting for 24 to 48 hours.  States this really takes it out of her constant dizziness and generalized weakness/fatigue.  No coffee-ground or hematemesis.  Does note epigastric pain as well with during these episodes and sometimes in between.  No chronic NSAID use.  No previous upper endoscopy.  States she had her appendix removed many years ago.  Does note marijuana use for the past 45 years "a couple times a week."  Very rare alcohol use, maybe once a month.  Has never undergone colonoscopy before.  Denies any family history of colorectal malignancy.  No melena hematochezia.  No unintentional weight loss.  Past Medical History:  Diagnosis Date   Dizziness    Migraine headache     No past surgical history on file.  Current Outpatient Medications  Medication Sig Dispense Refill   calcium carbonate (OS-CAL) 600 MG TABS Take 600 mg by mouth 2 (two) times daily with a meal.     dimenhyDRINATE (DRAMAMINE) 50 MG tablet Take 50 mg by mouth every 8 (eight) hours as needed. Dizziness     escitalopram (LEXAPRO) 20 MG tablet Take 20 mg by mouth daily.     gabapentin (NEURONTIN) 300 MG capsule Take 300 mg by mouth 3 (three) times daily.     loperamide (IMODIUM) 2 MG capsule Take 1 capsule (2 mg total) by mouth as needed for diarrhea or loose stools. 30 capsule 0   metoprolol tartrate (LOPRESSOR) 25 MG tablet Take 0.5 tablets  (12.5 mg total) by mouth 2 (two) times daily. 30 tablet 2   ondansetron (ZOFRAN) 4 MG tablet Take 1 tablet (4 mg total) by mouth every 6 (six) hours as needed for nausea. 20 tablet 0   rosuvastatin (CRESTOR) 10 MG tablet Take 10 mg by mouth daily.     tetrahydrozoline-zinc (VISINE-AC) 0.05-0.25 % ophthalmic solution Place 2 drops into both eyes 3 (three) times daily as needed. Dry Eyes     No current facility-administered medications for this visit.    Allergies as of 05/05/2021   (No Known Allergies)    Family History  Problem Relation Age of Onset   Breast cancer Mother     Social History   Socioeconomic History   Marital status: Married    Spouse name: Not on file   Number of children: Not on file   Years of education: Not on file   Highest education level: Not on file  Occupational History   Not on file  Tobacco Use   Smoking status: Every Day    Types: Cigarettes   Smokeless tobacco: Never  Substance and Sexual Activity   Alcohol use: No   Drug use: Yes    Types: Marijuana    Comment: smoked x 45 years, couple times per week   Sexual activity: Not on file  Other  Topics Concern   Not on file  Social History Narrative   Not on file   Social Determinants of Health   Financial Resource Strain: Not on file  Food Insecurity: Not on file  Transportation Needs: Not on file  Physical Activity: Not on file  Stress: Not on file  Social Connections: Not on file  Intimate Partner Violence: Not on file    Subjective: Review of Systems  Constitutional:  Negative for chills and fever.  HENT:  Negative for congestion and hearing loss.   Eyes:  Negative for blurred vision and double vision.  Respiratory:  Negative for cough and shortness of breath.   Cardiovascular:  Negative for chest pain and palpitations.  Gastrointestinal:  Positive for nausea and vomiting. Negative for abdominal pain, blood in stool, constipation, heartburn and melena.  Genitourinary:  Negative  for dysuria and urgency.  Musculoskeletal:  Negative for joint pain and myalgias.  Skin:  Negative for itching and rash.  Neurological:  Negative for dizziness and headaches.  Psychiatric/Behavioral:  Negative for depression. The patient is not nervous/anxious.       Objective: BP 97/67   Pulse (!) 58   Temp (!) 97.5 F (36.4 C)   Ht 5\' 5"  (1.651 m)   Wt 138 lb 6.4 oz (62.8 kg)   BMI 23.03 kg/m  Physical Exam Constitutional:      Appearance: Normal appearance.  HENT:     Head: Normocephalic and atraumatic.  Eyes:     Extraocular Movements: Extraocular movements intact.     Conjunctiva/sclera: Conjunctivae normal.  Cardiovascular:     Rate and Rhythm: Normal rate and regular rhythm.  Pulmonary:     Effort: Pulmonary effort is normal.     Breath sounds: Normal breath sounds.  Abdominal:     General: Bowel sounds are normal.     Palpations: Abdomen is soft.  Musculoskeletal:        General: No swelling. Normal range of motion.     Cervical back: Normal range of motion and neck supple.  Skin:    General: Skin is warm and dry.     Coloration: Skin is not jaundiced.  Neurological:     General: No focal deficit present.     Mental Status: She is alert and oriented to person, place, and time.  Psychiatric:        Mood and Affect: Mood normal.        Behavior: Behavior normal.     Assessment: *Nausea and vomiting-recurrent *Epigastric abdominal pain *Colon cancer screening  Plan: Etiology of patient's symptoms unclear. Will schedule for EGD to evaluate for peptic ulcer disease, esophagitis, gastritis, H. Pylori, duodenitis, or other. Will also evaluate for esophageal stricture, Schatzki's ring, esophageal web or other.   At the same time we will perform colonoscopy for colon cancer screening purposes.  The risks including infection, bleed, or perforation as well as benefits, limitations, alternatives and imponderables have been reviewed with the patient. Potential for  esophageal dilation, biopsy, etc. have also been reviewed.  Questions have been answered. All parties agreeable.  I will refill patient's Zofran today to take as needed.  Possible this is cannabinoid hyperemesis syndrome.  Recommended cessation of marijuana use.  Thank you Dr. for the kind referral.   05/05/2021 9:42 AM   Disclaimer: This note was dictated with voice recognition software. Similar sounding words can inadvertently be transcribed and may not be corrected upon review.

## 2021-05-05 NOTE — Patient Instructions (Signed)
We will schedule you for upper endoscopy to further evaluate your nausea, vomiting, abdominal pain.  At the same time we will perform colonoscopy for colon cancer screening purposes.  If this is unremarkable, then we will likely evaluate your gallbladder with ultrasound.  I will refill your Zofran today.  Possible this is all due to chronic marijuana use.  It was very nice meeting you today.  Dr. Marletta Lor  At [facility] we value your feedback. You may receive a survey about your visit today. Please share your experience as we strive to create trusting relationships with our patients to provide genuine, compassionate, quality care.  We appreciate your understanding and patience as we review any laboratory studies, imaging, and other diagnostic tests that are ordered as we care for you. Our office policy is 5 business days for review of these results, and any emergent or urgent results are addressed in a timely manner for your best interest. If you do not hear from our office in 1 week, please contact us.   We also encourage the use of MyChart, which contains your medical information for your review as well. If you are not enrolled in this feature, an access code is on this after visit summary for your convenience. Thank you for allowing [place] to be involved in your care.  It was great to see you today!  I hope you have a great rest of your summer!!    Hennie Duos. Marletta Lor, D.O. Gastroenterology and Hepatology [facility]

## 2021-05-05 NOTE — Telephone Encounter (Signed)
PA for TCS/EGD submitted via [facility] website. PA# [id number], valid 05/28/21-08/21/21.

## 2021-05-25 NOTE — Patient Instructions (Signed)
Helen Tapia  05/25/2021     @PREFPERIOPPHARMACY @   Your procedure is scheduled on  05/28/2021.    Report to 07/28/2021 at  0700  A.M.   Call this number if you have problems the morning of surgery:  [phone]   Remember:  Follow the diet and prep instructions given to you by the office.    Take these medicines the morning of surgery with A SIP OF WATER               lexapro, gabapentin, metoprolol, zofran (if needed).     Do not wear jewelry, make-up or nail polish.  Do not wear lotions, powders, or perfumes, or deodorant.  Do not shave 48 hours prior to surgery.  Men may shave face and neck.  Do not bring valuables to the hospital.  [facility] is not responsible for any belongings or valuables.  Contacts, dentures or bridgework may not be worn into surgery.  Leave your suitcase in the car.  After surgery it may be brought to your room.  For patients admitted to the hospital, discharge time will be determined by your treatment team.  Patients discharged the day of surgery will not be allowed to drive home and must have somene with them for 24 hours.    Special instructions:   DO NOT smoke tobacco or vape fore 24 hours before your procedure.  Please read over the following fact sheets that you were given. Anesthesia Post-op Instructions and Care and Recovery After Surgery      Upper Endoscopy, Adult, Care After This sheet gives you information about how to care for yourself after your procedure. Your health care provider may also give you more specific instructions. If you have problems or questions, contact your health care provider. What can I expect after the procedure? After the procedure, it is common to have: A sore throat. Mild stomach pain or discomfort. Bloating. Nausea. Follow these instructions at home:  Follow instructions from your health care provider about what to eat or drink after your procedure. Return to your normal activities  as told by your health care provider. Ask your health care provider what activities are safe for you. Take over-the-counter and prescription medicines only as told by your health care provider. If you were given a sedative during the procedure, it can affect you for several hours. Do not drive or operate machinery until your health care provider says that it is safe. Keep all follow-up visits as told by your health care provider. This is important. Contact a health care provider if you have: A sore throat that lasts longer than one day. Trouble swallowing. Get help right away if: You vomit blood or your vomit looks like coffee grounds. You have: A fever. Bloody, black, or tarry stools. A severe sore throat or you cannot swallow. Difficulty breathing. Severe pain in your chest or abdomen. Summary After the procedure, it is common to have a sore throat, mild stomach discomfort, bloating, and nausea. If you were given a sedative during the procedure, it can affect you for several hours. Do not drive or operate machinery until your health care provider says that it is safe. Follow instructions from your health care provider about what to eat or drink after your procedure. Return to your normal activities as told by your health care provider. This information is not intended to replace advice given to you by your health care provider. Make sure  you discuss any questions you have with your health care provider. Document Revised: 08/06/2019 Document Reviewed: 01/08/2018 Elsevier Patient Education  2022 Elsevier Inc. Colonoscopy, Adult, Care After This sheet gives you information about how to care for yourself after your procedure. Your health care provider may also give you more specific instructions. If you have problems or questions, contact your health care provider. What can I expect after the procedure? After the procedure, it is common to have: A small amount of blood in your stool for 24  hours after the procedure. Some gas. Mild cramping or bloating of your abdomen. Follow these instructions at home: Eating and drinking  Drink enough fluid to keep your urine pale yellow. Follow instructions from your health care provider about eating or drinking restrictions. Resume your normal diet as instructed by your health care provider. Avoid heavy or fried foods that are hard to digest. Activity Rest as told by your health care provider. Avoid sitting for a long time without moving. Get up to take short walks every 1-2 hours. This is important to improve blood flow and breathing. Ask for help if you feel weak or unsteady. Return to your normal activities as told by your health care provider. Ask your health care provider what activities are safe for you. Managing cramping and bloating  Try walking around when you have cramps or feel bloated. Apply heat to your abdomen as told by your health care provider. Use the heat source that your health care provider recommends, such as a moist heat pack or a heating pad. Place a towel between your skin and the heat source. Leave the heat on for 20-30 minutes. Remove the heat if your skin turns bright red. This is especially important if you are unable to feel pain, heat, or cold. You may have a greater risk of getting burned. General instructions If you were given a sedative during the procedure, it can affect you for several hours. Do not drive or operate machinery until your health care provider says that it is safe. For the first 24 hours after the procedure: Do not sign important documents. Do not drink alcohol. Do your regular daily activities at a slower pace than normal. Eat soft foods that are easy to digest. Take over-the-counter and prescription medicines only as told by your health care provider. Keep all follow-up visits as told by your health care provider. This is important. Contact a health care provider if: You have blood in  your stool 2-3 days after the procedure. Get help right away if you have: More than a small spotting of blood in your stool. Large blood clots in your stool. Swelling of your abdomen. Nausea or vomiting. A fever. Increasing pain in your abdomen that is not relieved with medicine. Summary After the procedure, it is common to have a small amount of blood in your stool. You may also have mild cramping and bloating of your abdomen. If you were given a sedative during the procedure, it can affect you for several hours. Do not drive or operate machinery until your health care provider says that it is safe. Get help right away if you have a lot of blood in your stool, nausea or vomiting, a fever, or increased pain in your abdomen. This information is not intended to replace advice given to you by your health care provider. Make sure you discuss any questions you have with your health care provider. Document Revised: 08/02/2019 Document Reviewed: 03/04/2019 Elsevier Patient Education  2022  Elsevier Inc. Monitored Anesthesia Care, Care After This sheet gives you information about how to care for yourself after your procedure. Your health care provider may also give you more specific instructions. If you have problems or questions, contact your health care provider. What can I expect after the procedure? After the procedure, it is common to have: Tiredness. Forgetfulness about what happened after the procedure. Impaired judgment for important decisions. Nausea or vomiting. Some difficulty with balance. Follow these instructions at home: For the time period you were told by your health care provider:   Rest as needed. Do not participate in activities where you could fall or become injured. Do not drive or use machinery. Do not drink alcohol. Do not take sleeping pills or medicines that cause drowsiness. Do not make important decisions or sign legal documents. Do not take care of children on  your own. Eating and drinking Follow the diet that is recommended by your health care provider. Drink enough fluid to keep your urine pale yellow. If you vomit: Drink water, juice, or soup when you can drink without vomiting. Make sure you have little or no nausea before eating solid foods. General instructions Have a responsible adult stay with you for the time you are told. It is important to have someone help care for you until you are awake and alert. Take over-the-counter and prescription medicines only as told by your health care provider. If you have sleep apnea, surgery and certain medicines can increase your risk for breathing problems. Follow instructions from your health care provider about wearing your sleep device: Anytime you are sleeping, including during daytime naps. While taking prescription pain medicines, sleeping medicines, or medicines that make you drowsy. Avoid smoking. Keep all follow-up visits as told by your health care provider. This is important. Contact a health care provider if: You keep feeling nauseous or you keep vomiting. You feel light-headed. You are still sleepy or having trouble with balance after 24 hours. You develop a rash. You have a fever. You have redness or swelling around the IV site. Get help right away if: You have trouble breathing. You have new-onset confusion at home. Summary For several hours after your procedure, you may feel tired. You may also be forgetful and have poor judgment. Have a responsible adult stay with you for the time you are told. It is important to have someone help care for you until you are awake and alert. Rest as told. Do not drive or operate machinery. Do not drink alcohol or take sleeping pills. Get help right away if you have trouble breathing, or if you suddenly become confused. This information is not intended to replace advice given to you by your health care provider. Make sure you discuss any questions  you have with your health care provider. Document Revised: 04/23/2020 Document Reviewed: 07/11/2019 Elsevier Patient Education  2022 [employer].

## 2021-05-26 ENCOUNTER — Encounter (HOSPITAL_COMMUNITY)
Admission: RE | Admit: 2021-05-26 | Discharge: 2021-05-26 | Disposition: A | Discharge status: Home / Self Care | Payer: 59 | Source: Ambulatory Visit | Attending: Internal Medicine | Admitting: Internal Medicine

## 2021-05-26 ENCOUNTER — Other Ambulatory Visit: Payer: Self-pay

## 2021-05-26 ENCOUNTER — Encounter (HOSPITAL_COMMUNITY): Payer: Self-pay

## 2021-05-26 HISTORY — DX: Anxiety disorder, unspecified: F41.9

## 2021-05-26 HISTORY — DX: Chronic obstructive pulmonary disease, unspecified: J44.9

## 2021-05-27 ENCOUNTER — Telehealth: Payer: Self-pay

## 2021-05-27 NOTE — Telephone Encounter (Signed)
TCS/EGD w/Propofol ASA 3 w/Dr. Marletta Lor has to be rescheduled d/t issue with city water supply.  Tried to call pt, no answer, no voicemail set-up.

## 2021-05-27 NOTE — Telephone Encounter (Signed)
Tried to call pt, no answer. LMOVM for her emergency contact.  Pt's emergency contact called office, she was with him. TCS/EGD rescheduled to 06/10/21 at 8:45am. Informed her pre-op nurse will inform her arrival time. New instructions mailed. Endo scheduler informed.

## 2021-06-07 NOTE — Patient Instructions (Signed)
Your procedure is scheduled on: 06/10/2021  Report to [facility] at     7:00 AM.  Call this number if you have problems the morning of surgery: [phone]   Remember:              Follow Directions on the letter you received from Your Physician's office regarding the Bowel Prep              No Smoking the day of Procedure :   Take these medicines the morning of surgery with A SIP OF WATER: Gabapentin, Lexapro, Metoprolol, and zofran   Do not wear jewelry, make-up or nail polish.    Do not bring valuables to the hospital.  Contacts, dentures or bridgework may not be worn into surgery.  .   Patients discharged the day of surgery will not be allowed to drive home.     Colonoscopy, Adult, Care After This sheet gives you information about how to care for yourself after your procedure. Your health care provider may also give you more specific instructions. If you have problems or questions, contact your health care provider. What can I expect after the procedure? After the procedure, it is common to have: A small amount of blood in your stool for 24 hours after the procedure. Some gas. Mild abdominal cramping or bloating.  Follow these instructions at home: General instructions  For the first 24 hours after the procedure: Do not drive or use machinery. Do not sign important documents. Do not drink alcohol. Do your regular daily activities at a slower pace than normal. Eat soft, easy-to-digest foods. Rest often. Take over-the-counter or prescription medicines only as told by your health care provider. It is up to you to get the results of your procedure. Ask your health care provider, or the department performing the procedure, when your results will be ready. Relieving cramping and bloating Try walking around when you have cramps or feel bloated. Apply heat to your abdomen as told by your health care provider. Use a heat source that your health care provider recommends, such  as a moist heat pack or a heating pad. Place a towel between your skin and the heat source. Leave the heat on for 20-30 minutes. Remove the heat if your skin turns bright red. This is especially important if you are unable to feel pain, heat, or cold. You may have a greater risk of getting burned. Eating and drinking Drink enough fluid to keep your urine clear or pale yellow. Resume your normal diet as instructed by your health care provider. Avoid heavy or fried foods that are hard to digest. Avoid drinking alcohol for as long as instructed by your health care provider. Contact a health care provider if: You have blood in your stool 2-3 days after the procedure. Get help right away if: You have more than a small spotting of blood in your stool. You pass large blood clots in your stool. Your abdomen is swollen. You have nausea or vomiting. You have a fever. You have increasing abdominal pain that is not relieved with medicine. This information is not intended to replace advice given to you by your health care provider. Make sure you discuss any questions you have with your health care provider. Document Released: 03/22/2004 Document Revised: 05/02/2016 Document Reviewed: 10/20/2015 Elsevier Interactive Patient Education  2018 Elsevier Inc.  Upper Endoscopy, Adult, Care After This sheet gives you information about how to care for yourself after your procedure. Your health care  provider may also give you more specific instructions. If you have problems or questions, contact your health care provider. What can I expect after the procedure? After the procedure, it is common to have: A sore throat. Mild stomach pain or discomfort. Bloating. Nausea. Follow these instructions at home:  Follow instructions from your health care provider about what to eat or drink after your procedure. Return to your normal activities as told by your health care provider. Ask your health care provider what  activities are safe for you. Take over-the-counter and prescription medicines only as told by your health care provider. If you were given a sedative during the procedure, it can affect you for several hours. Do not drive or operate machinery until your health care provider says that it is safe. Keep all follow-up visits as told by your health care provider. This is important. Contact a health care provider if you have: A sore throat that lasts longer than one day. Trouble swallowing. Get help right away if: You vomit blood or your vomit looks like coffee grounds. You have: A fever. Bloody, black, or tarry stools. A severe sore throat or you cannot swallow. Difficulty breathing. Severe pain in your chest or abdomen. Summary After the procedure, it is common to have a sore throat, mild stomach discomfort, bloating, and nausea. If you were given a sedative during the procedure, it can affect you for several hours. Do not drive or operate machinery until your health care provider says that it is safe. Follow instructions from your health care provider about what to eat or drink after your procedure. Return to your normal activities as told by your health care provider. This information is not intended to replace advice given to you by your health care provider. Make sure you discuss any questions you have with your health care provider. Document Revised: 08/06/2019 Document Reviewed: 01/08/2018 Elsevier Patient Education  2022 [employer].

## 2021-06-08 ENCOUNTER — Other Ambulatory Visit: Payer: Self-pay

## 2021-06-08 ENCOUNTER — Encounter (HOSPITAL_COMMUNITY)
Admission: RE | Admit: 2021-06-08 | Discharge: 2021-06-08 | Disposition: A | Discharge status: Home / Self Care | Payer: 59 | Source: Ambulatory Visit | Attending: Internal Medicine | Admitting: Internal Medicine

## 2021-06-08 ENCOUNTER — Telehealth: Payer: Self-pay | Admitting: Internal Medicine

## 2021-06-08 NOTE — Telephone Encounter (Signed)
Patient scheduled for 10/20 with Dr. Marletta Lor and requested to cancel and did not wish to r/s.

## 2021-06-08 NOTE — Telephone Encounter (Signed)
[phone] patient called to cancel procedure

## 2021-06-10 ENCOUNTER — Ambulatory Visit (HOSPITAL_COMMUNITY): Admission: RE | Admit: 2021-06-10 | Payer: 59 | Source: Home / Self Care

## 2021-06-10 ENCOUNTER — Encounter (HOSPITAL_COMMUNITY): Admission: RE | Payer: Self-pay | Source: Home / Self Care

## 2021-06-10 SURGERY — COLONOSCOPY WITH PROPOFOL
Anesthesia: Monitor Anesthesia Care

## 2021-07-19 ENCOUNTER — Other Ambulatory Visit: Payer: Self-pay

## 2021-07-19 ENCOUNTER — Encounter (HOSPITAL_COMMUNITY): Payer: Self-pay | Admitting: Emergency Medicine

## 2021-07-19 ENCOUNTER — Emergency Department (HOSPITAL_COMMUNITY): Payer: 59

## 2021-07-19 ENCOUNTER — Emergency Department (HOSPITAL_COMMUNITY)
Admission: EM | Admit: 2021-07-19 | Discharge: 2021-07-19 | Disposition: A | Discharge status: Home / Self Care | Payer: 59 | Attending: Emergency Medicine | Admitting: Emergency Medicine

## 2021-07-19 DIAGNOSIS — J449 Chronic obstructive pulmonary disease, unspecified: Secondary | ICD-10-CM | POA: Diagnosis not present

## 2021-07-19 DIAGNOSIS — S20212A Contusion of left front wall of thorax, initial encounter: Secondary | ICD-10-CM | POA: Insufficient documentation

## 2021-07-19 DIAGNOSIS — Z23 Encounter for immunization: Secondary | ICD-10-CM | POA: Diagnosis not present

## 2021-07-19 DIAGNOSIS — M542 Cervicalgia: Secondary | ICD-10-CM | POA: Insufficient documentation

## 2021-07-19 DIAGNOSIS — M549 Dorsalgia, unspecified: Secondary | ICD-10-CM | POA: Diagnosis not present

## 2021-07-19 DIAGNOSIS — R519 Headache, unspecified: Secondary | ICD-10-CM | POA: Diagnosis not present

## 2021-07-19 DIAGNOSIS — R109 Unspecified abdominal pain: Secondary | ICD-10-CM | POA: Diagnosis not present

## 2021-07-19 DIAGNOSIS — S299XXA Unspecified injury of thorax, initial encounter: Secondary | ICD-10-CM | POA: Diagnosis present

## 2021-07-19 DIAGNOSIS — S20219A Contusion of unspecified front wall of thorax, initial encounter: Secondary | ICD-10-CM

## 2021-07-19 DIAGNOSIS — F17210 Nicotine dependence, cigarettes, uncomplicated: Secondary | ICD-10-CM | POA: Diagnosis not present

## 2021-07-19 LAB — CBC WITH DIFFERENTIAL/PLATELET
Abs Immature Granulocytes: 0.04 10*3/uL (ref 0.00–0.07)
Basophils Absolute: 0 10*3/uL (ref 0.0–0.1)
Basophils Relative: 0 %
Eosinophils Absolute: 0.1 10*3/uL (ref 0.0–0.5)
Eosinophils Relative: 1 %
HCT: 41.3 % (ref 36.0–46.0)
Hemoglobin: 13.8 g/dL (ref 12.0–15.0)
Immature Granulocytes: 1 %
Lymphocytes Relative: 31 %
Lymphs Abs: 2.4 10*3/uL (ref 0.7–4.0)
MCH: 33 pg (ref 26.0–34.0)
MCHC: 33.4 g/dL (ref 30.0–36.0)
MCV: 98.8 fL (ref 80.0–100.0)
Monocytes Absolute: 0.7 10*3/uL (ref 0.1–1.0)
Monocytes Relative: 9 %
Neutro Abs: 4.4 10*3/uL (ref 1.7–7.7)
Neutrophils Relative %: 58 %
Platelets: 318 10*3/uL (ref 150–400)
RBC: 4.18 MIL/uL (ref 3.87–5.11)
RDW: 13.2 % (ref 11.5–15.5)
WBC: 7.5 10*3/uL (ref 4.0–10.5)
nRBC: 0 % (ref 0.0–0.2)

## 2021-07-19 LAB — COMPREHENSIVE METABOLIC PANEL
ALT: 30 U/L (ref 0–44)
AST: 29 U/L (ref 15–41)
Albumin: 3.9 g/dL (ref 3.5–5.0)
Alkaline Phosphatase: 69 U/L (ref 38–126)
Anion gap: 8 (ref 5–15)
BUN: 13 mg/dL (ref 8–23)
CO2: 28 mmol/L (ref 22–32)
Calcium: 8.8 mg/dL — ABNORMAL LOW (ref 8.9–10.3)
Chloride: 104 mmol/L (ref 98–111)
Creatinine, Ser: 0.78 mg/dL (ref 0.44–1.00)
GFR, Estimated: >60 mL/min (ref >60)
Glucose, Bld: 93 mg/dL (ref 70–99)
Potassium: 3.4 mmol/L — ABNORMAL LOW (ref 3.5–5.1)
Sodium: 140 mmol/L (ref 135–145)
Total Bilirubin: 0.6 mg/dL (ref 0.3–1.2)
Total Protein: 7.1 g/dL (ref 6.5–8.1)

## 2021-07-19 LAB — TROPONIN I (HIGH SENSITIVITY)
Troponin I (High Sensitivity): 3 ng/L (ref <18)
Troponin I (High Sensitivity): 3 ng/L (ref <18)

## 2021-07-19 MED ORDER — IOHEXOL 300 MG/ML  SOLN
100.0000 mL | Freq: Once | INTRAMUSCULAR | Status: AC | PRN
  Administered 2021-07-19: 21:00:00 100 mL via INTRAVENOUS

## 2021-07-19 MED ORDER — MORPHINE SULFATE (PF) 4 MG/ML IV SOLN
4.0000 mg | Freq: Once | INTRAVENOUS | Status: AC
Start: 2021-07-19 — End: 2021-07-19
  Administered 2021-07-19: 20:00:00 4 mg via INTRAVENOUS
  Filled 2021-07-19: qty 1

## 2021-07-19 MED ORDER — TETANUS-DIPHTH-ACELL PERTUSSIS 5-2.5-18.5 LF-MCG/0.5 IM SUSY
0.5000 mL | PREFILLED_SYRINGE | Freq: Once | INTRAMUSCULAR | Status: AC
  Administered 2021-07-19: 21:00:00 0.5 mL via INTRAMUSCULAR
  Filled 2021-07-19: qty 0.5

## 2021-07-19 NOTE — ED Notes (Signed)
MD at bedside. 

## 2021-07-19 NOTE — ED Notes (Signed)
Dc instructions reviewed with pt no questions or concerns at this time. Pt declined wheelchair and ambulated to lobby with grandson.
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
## 2021-07-19 NOTE — Discharge Instructions (Addendum)
If you develop new or worsening pain such as severe headache, chest pain, trouble breathing, or any other new/concerning symptoms then return to the ER for evaluation.

## 2021-09-01 DIAGNOSIS — U071 COVID-19: Secondary | ICD-10-CM | POA: Diagnosis not present

## 2021-09-09 DIAGNOSIS — U071 COVID-19: Secondary | ICD-10-CM | POA: Diagnosis not present

## 2021-09-11 DIAGNOSIS — E785 Hyperlipidemia, unspecified: Secondary | ICD-10-CM | POA: Diagnosis not present

## 2021-09-11 DIAGNOSIS — Y92002 Bathroom of unspecified non-institutional (private) residence single-family (private) house as the place of occurrence of the external cause: Secondary | ICD-10-CM | POA: Diagnosis not present

## 2021-09-11 DIAGNOSIS — M1990 Unspecified osteoarthritis, unspecified site: Secondary | ICD-10-CM | POA: Diagnosis not present

## 2021-09-11 DIAGNOSIS — Z8673 Personal history of transient ischemic attack (TIA), and cerebral infarction without residual deficits: Secondary | ICD-10-CM | POA: Diagnosis not present

## 2021-09-11 DIAGNOSIS — S2020XA Contusion of thorax, unspecified, initial encounter: Secondary | ICD-10-CM | POA: Diagnosis not present

## 2021-09-11 DIAGNOSIS — S2232XD Fracture of one rib, left side, subsequent encounter for fracture with routine healing: Secondary | ICD-10-CM | POA: Diagnosis not present

## 2021-09-11 DIAGNOSIS — Y92009 Unspecified place in unspecified non-institutional (private) residence as the place of occurrence of the external cause: Secondary | ICD-10-CM | POA: Diagnosis not present

## 2021-09-11 DIAGNOSIS — R0602 Shortness of breath: Secondary | ICD-10-CM | POA: Diagnosis not present

## 2021-09-11 DIAGNOSIS — Z79899 Other long term (current) drug therapy: Secondary | ICD-10-CM | POA: Diagnosis not present

## 2021-09-11 DIAGNOSIS — J9601 Acute respiratory failure with hypoxia: Secondary | ICD-10-CM | POA: Diagnosis not present

## 2021-09-11 DIAGNOSIS — R0902 Hypoxemia: Secondary | ICD-10-CM | POA: Diagnosis not present

## 2021-09-11 DIAGNOSIS — W010XXD Fall on same level from slipping, tripping and stumbling without subsequent striking against object, subsequent encounter: Secondary | ICD-10-CM | POA: Diagnosis not present

## 2021-09-11 DIAGNOSIS — F32A Depression, unspecified: Secondary | ICD-10-CM | POA: Diagnosis not present

## 2021-09-11 DIAGNOSIS — S2232XA Fracture of one rib, left side, initial encounter for closed fracture: Secondary | ICD-10-CM | POA: Diagnosis not present

## 2021-09-11 DIAGNOSIS — I110 Hypertensive heart disease with heart failure: Secondary | ICD-10-CM | POA: Diagnosis not present

## 2021-09-11 DIAGNOSIS — J479 Bronchiectasis, uncomplicated: Secondary | ICD-10-CM | POA: Diagnosis not present

## 2021-09-11 DIAGNOSIS — Z7982 Long term (current) use of aspirin: Secondary | ICD-10-CM | POA: Diagnosis not present

## 2021-09-11 DIAGNOSIS — W1839XA Other fall on same level, initial encounter: Secondary | ICD-10-CM | POA: Diagnosis not present

## 2021-09-11 DIAGNOSIS — Z9981 Dependence on supplemental oxygen: Secondary | ICD-10-CM | POA: Diagnosis not present

## 2021-09-11 DIAGNOSIS — G8929 Other chronic pain: Secondary | ICD-10-CM | POA: Diagnosis not present

## 2021-09-11 DIAGNOSIS — Z9049 Acquired absence of other specified parts of digestive tract: Secondary | ICD-10-CM | POA: Diagnosis not present

## 2021-09-11 DIAGNOSIS — J441 Chronic obstructive pulmonary disease with (acute) exacerbation: Secondary | ICD-10-CM | POA: Diagnosis not present

## 2021-09-11 DIAGNOSIS — W19XXXD Unspecified fall, subsequent encounter: Secondary | ICD-10-CM | POA: Diagnosis not present

## 2021-09-11 DIAGNOSIS — R519 Headache, unspecified: Secondary | ICD-10-CM | POA: Diagnosis not present

## 2021-09-11 DIAGNOSIS — Z792 Long term (current) use of antibiotics: Secondary | ICD-10-CM | POA: Diagnosis not present

## 2021-09-11 DIAGNOSIS — G43909 Migraine, unspecified, not intractable, without status migrainosus: Secondary | ICD-10-CM | POA: Diagnosis not present

## 2021-09-11 DIAGNOSIS — Z87891 Personal history of nicotine dependence: Secondary | ICD-10-CM | POA: Diagnosis not present

## 2021-09-11 DIAGNOSIS — I700 Atherosclerosis of aorta: Secondary | ICD-10-CM | POA: Diagnosis not present

## 2021-09-11 DIAGNOSIS — Z7952 Long term (current) use of systemic steroids: Secondary | ICD-10-CM | POA: Diagnosis not present

## 2021-09-11 DIAGNOSIS — J439 Emphysema, unspecified: Secondary | ICD-10-CM | POA: Diagnosis not present

## 2021-09-11 DIAGNOSIS — E874 Mixed disorder of acid-base balance: Secondary | ICD-10-CM | POA: Diagnosis not present

## 2021-09-11 DIAGNOSIS — Z720 Tobacco use: Secondary | ICD-10-CM | POA: Diagnosis not present

## 2021-09-11 DIAGNOSIS — I509 Heart failure, unspecified: Secondary | ICD-10-CM | POA: Diagnosis not present

## 2021-09-11 DIAGNOSIS — R059 Cough, unspecified: Secondary | ICD-10-CM | POA: Diagnosis not present

## 2021-09-11 DIAGNOSIS — Z20822 Contact with and (suspected) exposure to covid-19: Secondary | ICD-10-CM | POA: Diagnosis not present

## 2021-09-11 DIAGNOSIS — S20212A Contusion of left front wall of thorax, initial encounter: Secondary | ICD-10-CM | POA: Diagnosis not present

## 2021-09-12 DIAGNOSIS — Y92002 Bathroom of unspecified non-institutional (private) residence single-family (private) house as the place of occurrence of the external cause: Secondary | ICD-10-CM | POA: Diagnosis not present

## 2021-09-12 DIAGNOSIS — W010XXD Fall on same level from slipping, tripping and stumbling without subsequent striking against object, subsequent encounter: Secondary | ICD-10-CM | POA: Diagnosis not present

## 2021-09-12 DIAGNOSIS — Z79899 Other long term (current) drug therapy: Secondary | ICD-10-CM | POA: Diagnosis not present

## 2021-09-12 DIAGNOSIS — J441 Chronic obstructive pulmonary disease with (acute) exacerbation: Secondary | ICD-10-CM | POA: Diagnosis not present

## 2021-09-12 DIAGNOSIS — S2232XD Fracture of one rib, left side, subsequent encounter for fracture with routine healing: Secondary | ICD-10-CM | POA: Diagnosis not present

## 2021-09-12 DIAGNOSIS — Z9981 Dependence on supplemental oxygen: Secondary | ICD-10-CM | POA: Diagnosis not present

## 2021-09-21 DIAGNOSIS — Z87891 Personal history of nicotine dependence: Secondary | ICD-10-CM | POA: Diagnosis not present

## 2021-09-21 DIAGNOSIS — Z6823 Body mass index (BMI) 23.0-23.9, adult: Secondary | ICD-10-CM | POA: Diagnosis not present

## 2021-09-21 DIAGNOSIS — Z299 Encounter for prophylactic measures, unspecified: Secondary | ICD-10-CM | POA: Diagnosis not present

## 2021-09-21 DIAGNOSIS — I10 Essential (primary) hypertension: Secondary | ICD-10-CM | POA: Diagnosis not present

## 2021-09-21 DIAGNOSIS — E7800 Pure hypercholesterolemia, unspecified: Secondary | ICD-10-CM | POA: Diagnosis not present

## 2021-09-21 DIAGNOSIS — J441 Chronic obstructive pulmonary disease with (acute) exacerbation: Secondary | ICD-10-CM | POA: Diagnosis not present

## 2021-10-05 DIAGNOSIS — M8440XA Pathological fracture, unspecified site, initial encounter for fracture: Secondary | ICD-10-CM | POA: Diagnosis not present

## 2021-10-05 DIAGNOSIS — M8468XA Pathological fracture in other disease, other site, initial encounter for fracture: Secondary | ICD-10-CM | POA: Diagnosis not present

## 2021-10-11 DIAGNOSIS — I5031 Acute diastolic (congestive) heart failure: Secondary | ICD-10-CM | POA: Diagnosis not present

## 2021-10-11 DIAGNOSIS — R509 Fever, unspecified: Secondary | ICD-10-CM | POA: Diagnosis not present

## 2021-10-11 DIAGNOSIS — Z20822 Contact with and (suspected) exposure to covid-19: Secondary | ICD-10-CM | POA: Diagnosis not present

## 2021-10-11 DIAGNOSIS — Z87891 Personal history of nicotine dependence: Secondary | ICD-10-CM | POA: Diagnosis not present

## 2021-10-11 DIAGNOSIS — R0600 Dyspnea, unspecified: Secondary | ICD-10-CM | POA: Diagnosis not present

## 2021-10-11 DIAGNOSIS — R112 Nausea with vomiting, unspecified: Secondary | ICD-10-CM | POA: Diagnosis not present

## 2021-10-11 DIAGNOSIS — R1111 Vomiting without nausea: Secondary | ICD-10-CM | POA: Diagnosis not present

## 2021-10-11 DIAGNOSIS — I509 Heart failure, unspecified: Secondary | ICD-10-CM | POA: Diagnosis not present

## 2021-10-11 DIAGNOSIS — Z720 Tobacco use: Secondary | ICD-10-CM | POA: Diagnosis not present

## 2021-10-11 DIAGNOSIS — J440 Chronic obstructive pulmonary disease with acute lower respiratory infection: Secondary | ICD-10-CM | POA: Diagnosis not present

## 2021-10-11 DIAGNOSIS — Z743 Need for continuous supervision: Secondary | ICD-10-CM | POA: Diagnosis not present

## 2021-10-11 DIAGNOSIS — Z8673 Personal history of transient ischemic attack (TIA), and cerebral infarction without residual deficits: Secondary | ICD-10-CM | POA: Diagnosis not present

## 2021-10-11 DIAGNOSIS — R059 Cough, unspecified: Secondary | ICD-10-CM | POA: Diagnosis not present

## 2021-10-11 DIAGNOSIS — K2970 Gastritis, unspecified, without bleeding: Secondary | ICD-10-CM | POA: Diagnosis not present

## 2021-10-11 DIAGNOSIS — J9601 Acute respiratory failure with hypoxia: Secondary | ICD-10-CM | POA: Diagnosis not present

## 2021-10-11 DIAGNOSIS — A419 Sepsis, unspecified organism: Secondary | ICD-10-CM | POA: Diagnosis not present

## 2021-10-11 DIAGNOSIS — R0902 Hypoxemia: Secondary | ICD-10-CM | POA: Diagnosis not present

## 2021-10-11 DIAGNOSIS — Z792 Long term (current) use of antibiotics: Secondary | ICD-10-CM | POA: Diagnosis not present

## 2021-10-11 DIAGNOSIS — I10 Essential (primary) hypertension: Secondary | ICD-10-CM | POA: Diagnosis not present

## 2021-10-11 DIAGNOSIS — R918 Other nonspecific abnormal finding of lung field: Secondary | ICD-10-CM | POA: Diagnosis not present

## 2021-10-11 DIAGNOSIS — G8929 Other chronic pain: Secondary | ICD-10-CM | POA: Diagnosis not present

## 2021-10-11 DIAGNOSIS — J441 Chronic obstructive pulmonary disease with (acute) exacerbation: Secondary | ICD-10-CM | POA: Diagnosis not present

## 2021-10-11 DIAGNOSIS — R0602 Shortness of breath: Secondary | ICD-10-CM | POA: Diagnosis not present

## 2021-10-11 DIAGNOSIS — E785 Hyperlipidemia, unspecified: Secondary | ICD-10-CM | POA: Diagnosis not present

## 2021-10-20 DIAGNOSIS — Z7189 Other specified counseling: Secondary | ICD-10-CM | POA: Diagnosis not present

## 2021-10-20 DIAGNOSIS — I10 Essential (primary) hypertension: Secondary | ICD-10-CM | POA: Diagnosis not present

## 2021-10-20 DIAGNOSIS — Z299 Encounter for prophylactic measures, unspecified: Secondary | ICD-10-CM | POA: Diagnosis not present

## 2021-10-20 DIAGNOSIS — Z6823 Body mass index (BMI) 23.0-23.9, adult: Secondary | ICD-10-CM | POA: Diagnosis not present

## 2021-10-20 DIAGNOSIS — Z0000 Encounter for general adult medical examination without abnormal findings: Secondary | ICD-10-CM | POA: Diagnosis not present

## 2021-10-20 DIAGNOSIS — Z87891 Personal history of nicotine dependence: Secondary | ICD-10-CM | POA: Diagnosis not present

## 2021-11-07 DIAGNOSIS — U071 COVID-19: Secondary | ICD-10-CM | POA: Diagnosis not present

## 2021-11-30 DIAGNOSIS — U071 COVID-19: Secondary | ICD-10-CM | POA: Diagnosis not present

## 2021-12-08 DIAGNOSIS — U071 COVID-19: Secondary | ICD-10-CM | POA: Diagnosis not present

## 2021-12-20 DIAGNOSIS — Z299 Encounter for prophylactic measures, unspecified: Secondary | ICD-10-CM | POA: Diagnosis not present

## 2021-12-20 DIAGNOSIS — Z1211 Encounter for screening for malignant neoplasm of colon: Secondary | ICD-10-CM | POA: Diagnosis not present

## 2021-12-20 DIAGNOSIS — Z713 Dietary counseling and surveillance: Secondary | ICD-10-CM | POA: Diagnosis not present

## 2021-12-20 DIAGNOSIS — I10 Essential (primary) hypertension: Secondary | ICD-10-CM | POA: Diagnosis not present

## 2021-12-20 DIAGNOSIS — Z87891 Personal history of nicotine dependence: Secondary | ICD-10-CM | POA: Diagnosis not present

## 2021-12-20 DIAGNOSIS — Z6823 Body mass index (BMI) 23.0-23.9, adult: Secondary | ICD-10-CM | POA: Diagnosis not present

## 2022-01-29 DIAGNOSIS — W010XXA Fall on same level from slipping, tripping and stumbling without subsequent striking against object, initial encounter: Secondary | ICD-10-CM | POA: Diagnosis not present

## 2022-01-29 DIAGNOSIS — S12400A Unspecified displaced fracture of fifth cervical vertebra, initial encounter for closed fracture: Secondary | ICD-10-CM | POA: Diagnosis not present

## 2022-01-29 DIAGNOSIS — M25512 Pain in left shoulder: Secondary | ICD-10-CM | POA: Diagnosis not present

## 2022-01-29 DIAGNOSIS — S40912A Unspecified superficial injury of left shoulder, initial encounter: Secondary | ICD-10-CM | POA: Diagnosis not present

## 2022-01-29 DIAGNOSIS — F17210 Nicotine dependence, cigarettes, uncomplicated: Secondary | ICD-10-CM | POA: Diagnosis not present

## 2022-01-29 DIAGNOSIS — W19XXXA Unspecified fall, initial encounter: Secondary | ICD-10-CM | POA: Diagnosis not present

## 2022-01-29 DIAGNOSIS — M47812 Spondylosis without myelopathy or radiculopathy, cervical region: Secondary | ICD-10-CM | POA: Diagnosis not present

## 2022-01-29 DIAGNOSIS — Z8673 Personal history of transient ischemic attack (TIA), and cerebral infarction without residual deficits: Secondary | ICD-10-CM | POA: Diagnosis not present

## 2022-01-29 DIAGNOSIS — Z743 Need for continuous supervision: Secondary | ICD-10-CM | POA: Diagnosis not present

## 2022-01-29 DIAGNOSIS — S0990XA Unspecified injury of head, initial encounter: Secondary | ICD-10-CM | POA: Diagnosis not present

## 2022-01-29 DIAGNOSIS — M50222 Other cervical disc displacement at C5-C6 level: Secondary | ICD-10-CM | POA: Diagnosis not present

## 2022-01-29 DIAGNOSIS — S42292A Other displaced fracture of upper end of left humerus, initial encounter for closed fracture: Secondary | ICD-10-CM | POA: Diagnosis not present

## 2022-01-29 DIAGNOSIS — I10 Essential (primary) hypertension: Secondary | ICD-10-CM | POA: Diagnosis not present

## 2022-01-29 DIAGNOSIS — S42352A Displaced comminuted fracture of shaft of humerus, left arm, initial encounter for closed fracture: Secondary | ICD-10-CM | POA: Diagnosis not present

## 2022-01-29 DIAGNOSIS — M4602 Spinal enthesopathy, cervical region: Secondary | ICD-10-CM | POA: Diagnosis not present

## 2022-01-29 DIAGNOSIS — E785 Hyperlipidemia, unspecified: Secondary | ICD-10-CM | POA: Diagnosis not present

## 2022-01-29 DIAGNOSIS — R0902 Hypoxemia: Secondary | ICD-10-CM | POA: Diagnosis not present

## 2022-01-29 DIAGNOSIS — G43009 Migraine without aura, not intractable, without status migrainosus: Secondary | ICD-10-CM | POA: Diagnosis not present

## 2022-01-29 DIAGNOSIS — F32A Depression, unspecified: Secondary | ICD-10-CM | POA: Diagnosis not present

## 2022-01-29 DIAGNOSIS — M1990 Unspecified osteoarthritis, unspecified site: Secondary | ICD-10-CM | POA: Diagnosis not present

## 2022-01-29 DIAGNOSIS — M25519 Pain in unspecified shoulder: Secondary | ICD-10-CM | POA: Diagnosis not present

## 2022-01-29 DIAGNOSIS — F1090 Alcohol use, unspecified, uncomplicated: Secondary | ICD-10-CM | POA: Diagnosis not present

## 2022-01-29 DIAGNOSIS — R6889 Other general symptoms and signs: Secondary | ICD-10-CM | POA: Diagnosis not present

## 2022-01-29 DIAGNOSIS — D649 Anemia, unspecified: Secondary | ICD-10-CM | POA: Diagnosis not present

## 2022-01-30 DIAGNOSIS — Z8673 Personal history of transient ischemic attack (TIA), and cerebral infarction without residual deficits: Secondary | ICD-10-CM | POA: Diagnosis not present

## 2022-01-30 DIAGNOSIS — W19XXXA Unspecified fall, initial encounter: Secondary | ICD-10-CM | POA: Diagnosis not present

## 2022-01-30 DIAGNOSIS — G8929 Other chronic pain: Secondary | ICD-10-CM | POA: Diagnosis not present

## 2022-01-30 DIAGNOSIS — Z743 Need for continuous supervision: Secondary | ICD-10-CM | POA: Diagnosis not present

## 2022-01-30 DIAGNOSIS — R410 Disorientation, unspecified: Secondary | ICD-10-CM | POA: Diagnosis not present

## 2022-01-30 DIAGNOSIS — R0902 Hypoxemia: Secondary | ICD-10-CM | POA: Diagnosis not present

## 2022-01-30 DIAGNOSIS — I10 Essential (primary) hypertension: Secondary | ICD-10-CM | POA: Diagnosis not present

## 2022-01-30 DIAGNOSIS — S0990XA Unspecified injury of head, initial encounter: Secondary | ICD-10-CM | POA: Diagnosis not present

## 2022-01-30 DIAGNOSIS — S40022A Contusion of left upper arm, initial encounter: Secondary | ICD-10-CM | POA: Diagnosis not present

## 2022-01-30 DIAGNOSIS — W1839XA Other fall on same level, initial encounter: Secondary | ICD-10-CM | POA: Diagnosis not present

## 2022-01-30 DIAGNOSIS — E785 Hyperlipidemia, unspecified: Secondary | ICD-10-CM | POA: Diagnosis not present

## 2022-01-30 DIAGNOSIS — Z79899 Other long term (current) drug therapy: Secondary | ICD-10-CM | POA: Diagnosis not present

## 2022-01-30 DIAGNOSIS — I639 Cerebral infarction, unspecified: Secondary | ICD-10-CM | POA: Diagnosis not present

## 2022-01-30 DIAGNOSIS — F32A Depression, unspecified: Secondary | ICD-10-CM | POA: Diagnosis not present

## 2022-01-30 DIAGNOSIS — M542 Cervicalgia: Secondary | ICD-10-CM | POA: Diagnosis not present

## 2022-01-31 DIAGNOSIS — I639 Cerebral infarction, unspecified: Secondary | ICD-10-CM | POA: Diagnosis not present

## 2022-01-31 DIAGNOSIS — U071 COVID-19: Secondary | ICD-10-CM | POA: Diagnosis not present

## 2022-02-03 DIAGNOSIS — W1839XA Other fall on same level, initial encounter: Secondary | ICD-10-CM | POA: Diagnosis not present

## 2022-02-03 DIAGNOSIS — S42222A 2-part displaced fracture of surgical neck of left humerus, initial encounter for closed fracture: Secondary | ICD-10-CM | POA: Diagnosis not present

## 2022-02-03 DIAGNOSIS — E785 Hyperlipidemia, unspecified: Secondary | ICD-10-CM | POA: Diagnosis not present

## 2022-02-03 DIAGNOSIS — F17210 Nicotine dependence, cigarettes, uncomplicated: Secondary | ICD-10-CM | POA: Diagnosis not present

## 2022-02-03 DIAGNOSIS — I10 Essential (primary) hypertension: Secondary | ICD-10-CM | POA: Diagnosis not present

## 2022-02-03 DIAGNOSIS — M79645 Pain in left finger(s): Secondary | ICD-10-CM | POA: Diagnosis not present

## 2022-02-03 DIAGNOSIS — S40222A Blister (nonthermal) of left shoulder, initial encounter: Secondary | ICD-10-CM | POA: Diagnosis not present

## 2022-02-03 DIAGNOSIS — M25522 Pain in left elbow: Secondary | ICD-10-CM | POA: Diagnosis not present

## 2022-02-03 DIAGNOSIS — Z79899 Other long term (current) drug therapy: Secondary | ICD-10-CM | POA: Diagnosis not present

## 2022-02-03 DIAGNOSIS — S60445A External constriction of left ring finger, initial encounter: Secondary | ICD-10-CM | POA: Diagnosis not present

## 2022-02-03 DIAGNOSIS — M25512 Pain in left shoulder: Secondary | ICD-10-CM | POA: Diagnosis not present

## 2022-02-08 DIAGNOSIS — I10 Essential (primary) hypertension: Secondary | ICD-10-CM | POA: Diagnosis not present

## 2022-02-08 DIAGNOSIS — J449 Chronic obstructive pulmonary disease, unspecified: Secondary | ICD-10-CM | POA: Diagnosis not present

## 2022-02-08 DIAGNOSIS — Z9889 Other specified postprocedural states: Secondary | ICD-10-CM | POA: Diagnosis not present

## 2022-02-08 DIAGNOSIS — S42202A Unspecified fracture of upper end of left humerus, initial encounter for closed fracture: Secondary | ICD-10-CM | POA: Diagnosis not present

## 2022-02-08 DIAGNOSIS — Z01818 Encounter for other preprocedural examination: Secondary | ICD-10-CM | POA: Diagnosis not present

## 2022-02-08 DIAGNOSIS — J439 Emphysema, unspecified: Secondary | ICD-10-CM | POA: Diagnosis not present

## 2022-02-08 DIAGNOSIS — E785 Hyperlipidemia, unspecified: Secondary | ICD-10-CM | POA: Diagnosis not present

## 2022-02-08 DIAGNOSIS — G8918 Other acute postprocedural pain: Secondary | ICD-10-CM | POA: Diagnosis not present

## 2022-02-08 DIAGNOSIS — Z4789 Encounter for other orthopedic aftercare: Secondary | ICD-10-CM | POA: Diagnosis not present

## 2022-02-09 DIAGNOSIS — U071 COVID-19: Secondary | ICD-10-CM | POA: Diagnosis not present

## 2022-02-25 DIAGNOSIS — M25512 Pain in left shoulder: Secondary | ICD-10-CM | POA: Diagnosis not present

## 2022-03-02 DIAGNOSIS — U071 COVID-19: Secondary | ICD-10-CM | POA: Diagnosis not present

## 2022-03-11 DIAGNOSIS — U071 COVID-19: Secondary | ICD-10-CM | POA: Diagnosis not present

## 2022-03-11 DIAGNOSIS — M25512 Pain in left shoulder: Secondary | ICD-10-CM | POA: Diagnosis not present

## 2022-03-23 DIAGNOSIS — Z299 Encounter for prophylactic measures, unspecified: Secondary | ICD-10-CM | POA: Diagnosis not present

## 2022-03-23 DIAGNOSIS — I10 Essential (primary) hypertension: Secondary | ICD-10-CM | POA: Diagnosis not present

## 2022-03-23 DIAGNOSIS — Z6823 Body mass index (BMI) 23.0-23.9, adult: Secondary | ICD-10-CM | POA: Diagnosis not present

## 2022-03-23 DIAGNOSIS — Z0000 Encounter for general adult medical examination without abnormal findings: Secondary | ICD-10-CM | POA: Diagnosis not present

## 2022-03-25 DIAGNOSIS — F17210 Nicotine dependence, cigarettes, uncomplicated: Secondary | ICD-10-CM | POA: Diagnosis not present

## 2022-03-25 DIAGNOSIS — G4700 Insomnia, unspecified: Secondary | ICD-10-CM | POA: Diagnosis not present

## 2022-03-25 DIAGNOSIS — Z8616 Personal history of COVID-19: Secondary | ICD-10-CM | POA: Diagnosis not present

## 2022-03-25 DIAGNOSIS — R109 Unspecified abdominal pain: Secondary | ICD-10-CM | POA: Diagnosis not present

## 2022-03-25 DIAGNOSIS — Z888 Allergy status to other drugs, medicaments and biological substances status: Secondary | ICD-10-CM | POA: Diagnosis not present

## 2022-03-25 DIAGNOSIS — W1839XA Other fall on same level, initial encounter: Secondary | ICD-10-CM | POA: Diagnosis not present

## 2022-03-25 DIAGNOSIS — Z043 Encounter for examination and observation following other accident: Secondary | ICD-10-CM | POA: Diagnosis not present

## 2022-03-25 DIAGNOSIS — Z9981 Dependence on supplemental oxygen: Secondary | ICD-10-CM | POA: Diagnosis not present

## 2022-03-25 DIAGNOSIS — I700 Atherosclerosis of aorta: Secondary | ICD-10-CM | POA: Diagnosis not present

## 2022-03-25 DIAGNOSIS — E785 Hyperlipidemia, unspecified: Secondary | ICD-10-CM | POA: Diagnosis not present

## 2022-03-25 DIAGNOSIS — R52 Pain, unspecified: Secondary | ICD-10-CM | POA: Diagnosis not present

## 2022-03-25 DIAGNOSIS — R531 Weakness: Secondary | ICD-10-CM | POA: Diagnosis not present

## 2022-03-25 DIAGNOSIS — Z743 Need for continuous supervision: Secondary | ICD-10-CM | POA: Diagnosis not present

## 2022-03-25 DIAGNOSIS — I10 Essential (primary) hypertension: Secondary | ICD-10-CM | POA: Diagnosis not present

## 2022-03-25 DIAGNOSIS — G8929 Other chronic pain: Secondary | ICD-10-CM | POA: Diagnosis not present

## 2022-03-25 DIAGNOSIS — G43909 Migraine, unspecified, not intractable, without status migrainosus: Secondary | ICD-10-CM | POA: Diagnosis not present

## 2022-03-25 DIAGNOSIS — J449 Chronic obstructive pulmonary disease, unspecified: Secondary | ICD-10-CM | POA: Diagnosis not present

## 2022-03-25 DIAGNOSIS — Z7982 Long term (current) use of aspirin: Secondary | ICD-10-CM | POA: Diagnosis not present

## 2022-03-25 DIAGNOSIS — F32A Depression, unspecified: Secondary | ICD-10-CM | POA: Diagnosis not present

## 2022-03-25 DIAGNOSIS — Z7289 Other problems related to lifestyle: Secondary | ICD-10-CM | POA: Diagnosis not present

## 2022-03-25 DIAGNOSIS — S0990XA Unspecified injury of head, initial encounter: Secondary | ICD-10-CM | POA: Diagnosis not present

## 2022-03-29 DIAGNOSIS — M25512 Pain in left shoulder: Secondary | ICD-10-CM | POA: Diagnosis not present

## 2022-03-29 DIAGNOSIS — M25522 Pain in left elbow: Secondary | ICD-10-CM | POA: Diagnosis not present

## 2022-04-01 DIAGNOSIS — R410 Disorientation, unspecified: Secondary | ICD-10-CM | POA: Diagnosis not present

## 2022-04-01 DIAGNOSIS — N3001 Acute cystitis with hematuria: Secondary | ICD-10-CM | POA: Diagnosis not present

## 2022-04-01 DIAGNOSIS — R6889 Other general symptoms and signs: Secondary | ICD-10-CM | POA: Diagnosis not present

## 2022-04-01 DIAGNOSIS — E785 Hyperlipidemia, unspecified: Secondary | ICD-10-CM | POA: Diagnosis not present

## 2022-04-01 DIAGNOSIS — F17210 Nicotine dependence, cigarettes, uncomplicated: Secondary | ICD-10-CM | POA: Diagnosis not present

## 2022-04-01 DIAGNOSIS — R0902 Hypoxemia: Secondary | ICD-10-CM | POA: Diagnosis not present

## 2022-04-01 DIAGNOSIS — R404 Transient alteration of awareness: Secondary | ICD-10-CM | POA: Diagnosis not present

## 2022-04-01 DIAGNOSIS — Z743 Need for continuous supervision: Secondary | ICD-10-CM | POA: Diagnosis not present

## 2022-04-01 DIAGNOSIS — I10 Essential (primary) hypertension: Secondary | ICD-10-CM | POA: Diagnosis not present

## 2022-04-01 DIAGNOSIS — I4581 Long QT syndrome: Secondary | ICD-10-CM | POA: Diagnosis not present

## 2022-04-01 DIAGNOSIS — R9431 Abnormal electrocardiogram [ECG] [EKG]: Secondary | ICD-10-CM | POA: Diagnosis not present

## 2022-04-01 DIAGNOSIS — R825 Elevated urine levels of drugs, medicaments and biological substances: Secondary | ICD-10-CM | POA: Diagnosis not present

## 2022-04-05 DIAGNOSIS — M25512 Pain in left shoulder: Secondary | ICD-10-CM | POA: Diagnosis not present

## 2022-04-05 DIAGNOSIS — I10 Essential (primary) hypertension: Secondary | ICD-10-CM | POA: Diagnosis not present

## 2022-04-05 DIAGNOSIS — N390 Urinary tract infection, site not specified: Secondary | ICD-10-CM | POA: Diagnosis not present

## 2022-04-05 DIAGNOSIS — Z299 Encounter for prophylactic measures, unspecified: Secondary | ICD-10-CM | POA: Diagnosis not present

## 2022-04-05 DIAGNOSIS — M25522 Pain in left elbow: Secondary | ICD-10-CM | POA: Diagnosis not present

## 2022-04-05 DIAGNOSIS — G540 Brachial plexus disorders: Secondary | ICD-10-CM | POA: Diagnosis not present

## 2022-04-05 DIAGNOSIS — M25412 Effusion, left shoulder: Secondary | ICD-10-CM | POA: Diagnosis not present

## 2022-04-05 DIAGNOSIS — F17210 Nicotine dependence, cigarettes, uncomplicated: Secondary | ICD-10-CM | POA: Diagnosis not present

## 2022-04-05 DIAGNOSIS — S42222D 2-part displaced fracture of surgical neck of left humerus, subsequent encounter for fracture with routine healing: Secondary | ICD-10-CM | POA: Diagnosis not present

## 2022-04-05 DIAGNOSIS — F32A Depression, unspecified: Secondary | ICD-10-CM | POA: Diagnosis not present

## 2022-04-11 DIAGNOSIS — M25412 Effusion, left shoulder: Secondary | ICD-10-CM | POA: Diagnosis not present

## 2022-04-13 DIAGNOSIS — G629 Polyneuropathy, unspecified: Secondary | ICD-10-CM | POA: Diagnosis not present

## 2022-04-13 DIAGNOSIS — G5603 Carpal tunnel syndrome, bilateral upper limbs: Secondary | ICD-10-CM | POA: Diagnosis not present

## 2022-04-18 DIAGNOSIS — M25512 Pain in left shoulder: Secondary | ICD-10-CM | POA: Diagnosis not present

## 2022-04-19 DIAGNOSIS — M25512 Pain in left shoulder: Secondary | ICD-10-CM | POA: Diagnosis not present

## 2022-04-19 DIAGNOSIS — S4292XA Fracture of left shoulder girdle, part unspecified, initial encounter for closed fracture: Secondary | ICD-10-CM | POA: Diagnosis not present

## 2022-04-19 DIAGNOSIS — Z01818 Encounter for other preprocedural examination: Secondary | ICD-10-CM | POA: Diagnosis not present

## 2022-04-20 DIAGNOSIS — Z79899 Other long term (current) drug therapy: Secondary | ICD-10-CM | POA: Diagnosis not present

## 2022-04-20 DIAGNOSIS — J449 Chronic obstructive pulmonary disease, unspecified: Secondary | ICD-10-CM | POA: Diagnosis not present

## 2022-04-20 DIAGNOSIS — G8929 Other chronic pain: Secondary | ICD-10-CM | POA: Diagnosis not present

## 2022-04-20 DIAGNOSIS — M75102 Unspecified rotator cuff tear or rupture of left shoulder, not specified as traumatic: Secondary | ICD-10-CM | POA: Diagnosis not present

## 2022-04-20 DIAGNOSIS — Z8616 Personal history of COVID-19: Secondary | ICD-10-CM | POA: Diagnosis not present

## 2022-04-20 DIAGNOSIS — Z7982 Long term (current) use of aspirin: Secondary | ICD-10-CM | POA: Diagnosis not present

## 2022-04-20 DIAGNOSIS — J439 Emphysema, unspecified: Secondary | ICD-10-CM | POA: Diagnosis not present

## 2022-04-20 DIAGNOSIS — T8484XA Pain due to internal orthopedic prosthetic devices, implants and grafts, initial encounter: Secondary | ICD-10-CM | POA: Diagnosis not present

## 2022-04-20 DIAGNOSIS — F17210 Nicotine dependence, cigarettes, uncomplicated: Secondary | ICD-10-CM | POA: Diagnosis not present

## 2022-04-20 DIAGNOSIS — I10 Essential (primary) hypertension: Secondary | ICD-10-CM | POA: Diagnosis not present

## 2022-04-20 DIAGNOSIS — G8918 Other acute postprocedural pain: Secondary | ICD-10-CM | POA: Diagnosis not present

## 2022-04-20 DIAGNOSIS — M25512 Pain in left shoulder: Secondary | ICD-10-CM | POA: Diagnosis not present

## 2022-04-20 DIAGNOSIS — T84098A Other mechanical complication of other internal joint prosthesis, initial encounter: Secondary | ICD-10-CM | POA: Diagnosis not present

## 2022-04-20 DIAGNOSIS — E785 Hyperlipidemia, unspecified: Secondary | ICD-10-CM | POA: Diagnosis not present

## 2022-04-20 DIAGNOSIS — T8489XA Other specified complication of internal orthopedic prosthetic devices, implants and grafts, initial encounter: Secondary | ICD-10-CM | POA: Diagnosis not present

## 2022-04-20 DIAGNOSIS — S42292D Other displaced fracture of upper end of left humerus, subsequent encounter for fracture with routine healing: Secondary | ICD-10-CM | POA: Diagnosis not present

## 2022-04-22 DIAGNOSIS — U071 COVID-19: Secondary | ICD-10-CM | POA: Diagnosis not present

## 2022-05-05 DIAGNOSIS — M25512 Pain in left shoulder: Secondary | ICD-10-CM | POA: Diagnosis not present

## 2022-05-16 DIAGNOSIS — Z6821 Body mass index (BMI) 21.0-21.9, adult: Secondary | ICD-10-CM | POA: Diagnosis not present

## 2022-05-16 DIAGNOSIS — Z299 Encounter for prophylactic measures, unspecified: Secondary | ICD-10-CM | POA: Diagnosis not present

## 2022-05-16 DIAGNOSIS — F17210 Nicotine dependence, cigarettes, uncomplicated: Secondary | ICD-10-CM | POA: Diagnosis not present

## 2022-05-16 DIAGNOSIS — F32A Depression, unspecified: Secondary | ICD-10-CM | POA: Diagnosis not present

## 2022-05-16 DIAGNOSIS — I10 Essential (primary) hypertension: Secondary | ICD-10-CM | POA: Diagnosis not present
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
## 2022-06-02 DIAGNOSIS — U071 COVID-19: Secondary | ICD-10-CM | POA: Diagnosis not present

## 2022-06-02 DIAGNOSIS — M25512 Pain in left shoulder: Secondary | ICD-10-CM | POA: Diagnosis not present

## 2022-06-02 DIAGNOSIS — Z09 Encounter for follow-up examination after completed treatment for conditions other than malignant neoplasm: Secondary | ICD-10-CM | POA: Diagnosis not present

## 2022-06-11 DIAGNOSIS — U071 COVID-19: Secondary | ICD-10-CM | POA: Diagnosis not present

## 2022-07-03 DIAGNOSIS — U071 COVID-19: Secondary | ICD-10-CM | POA: Diagnosis not present

## 2022-07-12 DIAGNOSIS — U071 COVID-19: Secondary | ICD-10-CM | POA: Diagnosis not present

## 2022-07-25 DIAGNOSIS — Z743 Need for continuous supervision: Secondary | ICD-10-CM | POA: Diagnosis not present

## 2022-07-25 DIAGNOSIS — I469 Cardiac arrest, cause unspecified: Secondary | ICD-10-CM | POA: Diagnosis not present

## 2022-08-02 DIAGNOSIS — U071 COVID-19: Secondary | ICD-10-CM | POA: Diagnosis not present

## 2022-08-11 DIAGNOSIS — U071 COVID-19: Secondary | ICD-10-CM | POA: Diagnosis not present

## 2024-05-21 ENCOUNTER — Other Ambulatory Visit: Payer: Self-pay | Admitting: Internal Medicine

## 2024-05-21 DIAGNOSIS — Z1231 Encounter for screening mammogram for malignant neoplasm of breast: Secondary | ICD-10-CM

## 2024-05-28 ENCOUNTER — Inpatient Hospital Stay: Admission: RE | Admit: 2024-05-28 | Source: Ambulatory Visit
